# Patient Record
Sex: Male | Born: 1935
Health system: Southern US, Community
[De-identification: ages and names within clinical notes are randomized; demographics above are authoritative.]

## PROBLEM LIST (undated history)

## (undated) DIAGNOSIS — N189 Chronic kidney disease, unspecified: Secondary | ICD-10-CM

## (undated) DIAGNOSIS — E7800 Pure hypercholesterolemia, unspecified: Secondary | ICD-10-CM

## (undated) DIAGNOSIS — Z0389 Encounter for observation for other suspected diseases and conditions ruled out: Secondary | ICD-10-CM

## (undated) DIAGNOSIS — Z952 Presence of prosthetic heart valve: Secondary | ICD-10-CM

## (undated) DIAGNOSIS — I517 Cardiomegaly: Secondary | ICD-10-CM

## (undated) DIAGNOSIS — E785 Hyperlipidemia, unspecified: Secondary | ICD-10-CM

## (undated) DIAGNOSIS — I10 Essential (primary) hypertension: Secondary | ICD-10-CM

## (undated) DIAGNOSIS — I350 Nonrheumatic aortic (valve) stenosis: Secondary | ICD-10-CM

## (undated) DIAGNOSIS — I452 Bifascicular block: Secondary | ICD-10-CM

## (undated) HISTORY — DX: Essential (primary) hypertension: I10

## (undated) HISTORY — DX: Nonrheumatic aortic (valve) stenosis: I35.0

## (undated) HISTORY — DX: Hyperlipidemia, unspecified: E78.5

## (undated) HISTORY — DX: Encounter for observation for other suspected diseases and conditions ruled out: Z03.89

## (undated) HISTORY — DX: Pure hypercholesterolemia, unspecified: E78.00

## (undated) HISTORY — DX: Chronic kidney disease, unspecified: N18.9

## (undated) HISTORY — DX: Cardiomegaly: I51.7

## (undated) HISTORY — DX: Bifascicular block: I45.2

## (undated) HISTORY — PX: ELBOW SURGERY: SHX618

---

## 2009-06-19 DIAGNOSIS — I359 Nonrheumatic aortic valve disorder, unspecified: Secondary | ICD-10-CM | POA: Insufficient documentation

## 2009-06-19 DIAGNOSIS — I350 Nonrheumatic aortic (valve) stenosis: Secondary | ICD-10-CM

## 2009-06-19 HISTORY — DX: Nonrheumatic aortic (valve) stenosis: I35.0

## 2013-11-01 ENCOUNTER — Encounter: Payer: Self-pay | Admitting: Interventional Cardiology

## 2013-11-01 ENCOUNTER — Ambulatory Visit (INDEPENDENT_AMBULATORY_CARE_PROVIDER_SITE_OTHER): Payer: Commercial Managed Care - HMO | Admitting: Interventional Cardiology

## 2013-11-01 VITALS — BP 109/74 | HR 103 | Ht 70.0 in | Wt 230.0 lb

## 2013-11-01 DIAGNOSIS — E7800 Pure hypercholesterolemia, unspecified: Secondary | ICD-10-CM

## 2013-11-01 DIAGNOSIS — I517 Cardiomegaly: Secondary | ICD-10-CM

## 2013-11-01 DIAGNOSIS — I10 Essential (primary) hypertension: Secondary | ICD-10-CM

## 2013-11-01 DIAGNOSIS — I359 Nonrheumatic aortic valve disorder, unspecified: Secondary | ICD-10-CM

## 2013-11-01 DIAGNOSIS — I350 Nonrheumatic aortic (valve) stenosis: Secondary | ICD-10-CM

## 2013-11-01 DIAGNOSIS — Z0389 Encounter for observation for other suspected diseases and conditions ruled out: Secondary | ICD-10-CM | POA: Insufficient documentation

## 2013-11-01 LAB — LIPID PANEL
CHOLESTEROL: 200 mg/dL (ref 0–200)
HDL: 42.9 mg/dL (ref >39.00)
LDL Cholesterol: 135 mg/dL — ABNORMAL HIGH (ref 0–99)
NONHDL: 157.1
Total CHOL/HDL Ratio: 5
Triglycerides: 109 mg/dL (ref 0.0–149.0)
VLDL: 21.8 mg/dL (ref 0.0–40.0)

## 2013-11-01 NOTE — Progress Notes (Signed)
Patient ID: [name], male   DOB: Dec 23, 1931, 80 y.o.   MRN: [id number]    [place], [place], [place]  [address] Phone: [phone] Fax:  [phone]  Date:  11/01/2013   ID:  Robert Tapia, DOB 01/20/1932, MRN [id number]  PCP:  Milagros Evener, MD      History of Present Illness:  Robert Tapia is a 80 y.o. male with severe aortic stenosis, HTN, LVH, and HLD.  Recently, no CP with walking. He stopped walking daily for 15-30 minutes. He gets a little SHOB but he thinks this is related to age. No syncope. He has some swelling in his legs.  Uses compression stockings.  Has dyspnea with exertion (if walking fast), but not at rest. Has dizziness with sudden change in position. Denies syncope, chest pain, palpitations, orthopnea, PND, or worsening of RLE edema. BP around 110-130/70-84 at home. HR is around 70-76 bpm at home, but he states he has hx of tachycardia. Walks up steps and does yard work w/o shortness of breath or chest pain.   Wt Readings from Last 3 Encounters:  11/01/13 230 lb (104.327 kg)     Past Medical History  Diagnosis Date  . HTN (hypertension)   . Hyperlipidemia     Pt. declines cholesterol medication (07/03/09)  . Chronic renal disease     Referred Dr. Marval Regal 08/2009  . Severe aortic stenosis 06/2009    Moderate AS in the past. ECHO 11/05/12 - LV EF estimated by 2D at 65-70%. Mild left atrial enlargement. Mild mitral valve regurgitation. Severe aortic valve stenosis. Mild aortic root dilatation. Moderate concentric LV hypertrophy.  . Aortic valve disorders 06/2009    Moderate AS in the past. ECHO 11/05/12 - LV EF estimated by 2D at 65-70%. Mild left atrial enlargement. Mild mitral valve regurgitation. Severe aortic valve stenosis. Mild aortic root dilatation. Moderate concentric LV hypertrophy.  . Observation for suspected cardiovascular disease   . Essential hypertension, benign   . Pure hypercholesterolemia     LDL 164   . Lower extremity edema       Kidney cleanse has helped.  . Left ventricular hypertrophy     Moderate concentric LV hypertrophy by ECHO 11/05/12    Current Outpatient Prescriptions  Medication Sig Dispense Refill  . amLODipine (NORVASC) 10 MG tablet Take 10 mg by mouth daily.      . Garlic 0630 MG CAPS       . lisinopril-hydrochlorothiazide (PRINZIDE,ZESTORETIC) 20-25 MG per tablet Take 1 tablet by mouth daily.      . Multiple Vitamin (MULTIVITAMIN) capsule Take 1 capsule by mouth daily.      . niacin 500 MG tablet Take 500 mg by mouth daily.      . Nutritional Supplements (KIDNEY) CAPS Take 3 capsules by mouth daily. (Cleanse)      . Omega-3 Fatty Acids (MARINE LIPID CONCENTRATE) 240-360-5 MG-MG-UNIT CAPS Take 2 capsules by mouth daily.      Marland Kitchen Specialty Vitamins Products (HEART SAVIOR PO)        No current facility-administered medications for this visit.    Allergies:   No Known Allergies  Social History:  The patient  reports that he has never smoked. He does not have any smokeless tobacco history on file. He reports that he does not drink alcohol or use illicit drugs.   Family History:  The patient's family history includes Diabetes in his mother; Heart attack in his brother.   ROS:  Please see the  history of present illness.  No nausea, vomiting.  No fevers, chills.  No focal weakness.  No dysuria. *DOE   All other systems reviewed and negative.   PHYSICAL EXAM: VS:  BP 109/74  Pulse 103  Ht 5\' 10"  (1.778 m)  Wt 230 lb (104.327 kg)  BMI 33.00 kg/m2 Well nourished, well developed, in no acute distress HEENT: normal Neck: no JVD, no carotid bruits Cardiac:  normal S1, S2; RRR; 3/6 systolic murmur Lungs:  clear to auscultation bilaterally, no wheezing, rhonchi or rales Abd: soft, nontender, no hepatomegaly Ext: no edema Skin: warm and dry Neuro:   no focal abnormalities noted  EKG:  Sinus tahycardia, RBBB     ASSESSMENT AND PLAN:  Treatment  1. Aortic valve disorders  Notes: Severe AS in  2014. No CP, CHF, or syncope.Discussed sx to watch for regarding AS.  Mild DOE when walking fast.  lan for ETT to better evaluate exercise capacity.   2. Observation for suspected cardiovascular disease  IMAGING: EKG    Harward,Amy 10/25/2012 10:07:56 AM > VARANASI,JAY 10/25/2012 10:41:41 AM > NSR, RBBB   3. Essential hypertension, benign  Continue Amlodipine Besylate Tablet, 10MG , take one tablet by mouth every day Continue Lisinopril-Hydrochlorothiazide Tablet, 20-25 MG, 1 tablet, Orally, Once a day.  Controlled   4. Hypercholesteremia, pure  Continue Marine Lipid Concentrate Capsule, 240-360-5 MG-MG-UNIT, 2 capsule, Orally, Daily Continue Garlic Capsule, 4098 MG, Orally Notes: LDL 164 in 2013. Needs recheck. May benefit from statin if still elevated.    Preventive Medicine  Adult topics discussed:  Diet: healthy diet.  Exercise: 5 days a week, at least 30 minutes of aerobic exercise.      Signed, Mina Marble, MD, [facility] 11/01/2013 10:24 AM

## 2013-11-01 NOTE — Patient Instructions (Signed)
Your physician has requested that you have an exercise tolerance test. For further information please visit [email or web address]. Please also follow instruction sheet, as given.  Your physician wants you to follow-up in:  1 year with [name].  You will receive a reminder letter in the mail two months in advance. If you don't receive a letter, please call our office to schedule the follow-up appointment.

## 2013-11-02 ENCOUNTER — Other Ambulatory Visit: Payer: Self-pay | Admitting: Cardiology

## 2013-11-02 DIAGNOSIS — E7800 Pure hypercholesterolemia, unspecified: Secondary | ICD-10-CM

## 2013-12-02 ENCOUNTER — Telehealth (HOSPITAL_COMMUNITY): Payer: Self-pay

## 2013-12-02 NOTE — Telephone Encounter (Signed)
Encounter complete. 

## 2013-12-06 ENCOUNTER — Telehealth (HOSPITAL_COMMUNITY): Payer: Self-pay

## 2013-12-06 NOTE — Telephone Encounter (Signed)
Encounter complete. 

## 2013-12-07 ENCOUNTER — Ambulatory Visit (HOSPITAL_COMMUNITY)
Admission: RE | Admit: 2013-12-07 | Discharge: 2013-12-07 | Disposition: A | Discharge status: Home / Self Care | Payer: Medicare HMO | Source: Ambulatory Visit | Attending: Interventional Cardiology | Admitting: Interventional Cardiology

## 2013-12-07 DIAGNOSIS — R0609 Other forms of dyspnea: Secondary | ICD-10-CM

## 2013-12-07 DIAGNOSIS — I359 Nonrheumatic aortic valve disorder, unspecified: Secondary | ICD-10-CM

## 2013-12-07 DIAGNOSIS — R0989 Other specified symptoms and signs involving the circulatory and respiratory systems: Secondary | ICD-10-CM

## 2013-12-07 NOTE — Procedures (Signed)
Exercise Treadmill Test  Test  Exercise Tolerance Test Ordering MD: Lendell Caprice, MD  Interpreting MD:   Unique Test No:1  Treadmill:  1  Indication for ETT: chest pain - rule out ischemia  Contraindication to ETT: Yes   Stress Modality: exercise - treadmill  Cardiac Imaging Performed: non   Protocol: standard Bruce - maximal  Max BP:  146/56  Max MPHR (bpm):  140 85% MPR (bpm): 119  MPHR obtained (bpm):  162 % MPHR obtained:  115  Reached 85% MPHR (min:sec): 3:51 Total Exercise Time (min-sec):  3:51  Workload in METS: 5.60 Borg Scale:  Reason ETT Terminated:  fatigue    ST Segment Analysis At Rest: RBBB, tachycardia at rest With Exercise: no evidence of significant ST depression  Other Information Arrhythmia:  No Angina during ETT:  No angina, but extreme dyspnea Quality of ETT:  indeterminate  ETT Interpretation:  borderline (indeterminate) with non-specific ST changes  Comments: Very poor exercise tolerance THR was achieved, however, tachycardic at rest with minimal exercise RBBB with difficult to interpret ST changes, but no diagnostic ischemic changes Mild hypotension with exercise  Recommendations: Recommend more definitive additional imaging study  Pixie Casino, MD, [facility] Attending Cardiologist [place]

## 2013-12-09 ENCOUNTER — Other Ambulatory Visit: Payer: Self-pay | Admitting: Cardiology

## 2013-12-09 DIAGNOSIS — I350 Nonrheumatic aortic (valve) stenosis: Secondary | ICD-10-CM

## 2013-12-16 ENCOUNTER — Encounter: Payer: Self-pay | Admitting: Thoracic Surgery (Cardiothoracic Vascular Surgery)

## 2013-12-16 ENCOUNTER — Other Ambulatory Visit: Payer: Self-pay | Admitting: *Deleted

## 2013-12-16 ENCOUNTER — Ambulatory Visit
Admission: RE | Admit: 2013-12-16 | Discharge: 2013-12-16 | Disposition: A | Discharge status: Home / Self Care | Payer: Commercial Managed Care - HMO | Source: Ambulatory Visit | Attending: Thoracic Surgery (Cardiothoracic Vascular Surgery) | Admitting: Thoracic Surgery (Cardiothoracic Vascular Surgery)

## 2013-12-16 ENCOUNTER — Institutional Professional Consult (permissible substitution) (INDEPENDENT_AMBULATORY_CARE_PROVIDER_SITE_OTHER): Payer: Commercial Managed Care - HMO | Admitting: Thoracic Surgery (Cardiothoracic Vascular Surgery)

## 2013-12-16 VITALS — BP 126/78 | HR 85 | Resp 16 | Ht 70.0 in | Wt 231.0 lb

## 2013-12-16 DIAGNOSIS — I359 Nonrheumatic aortic valve disorder, unspecified: Secondary | ICD-10-CM

## 2013-12-16 DIAGNOSIS — I350 Nonrheumatic aortic (valve) stenosis: Secondary | ICD-10-CM

## 2013-12-16 DIAGNOSIS — I712 Thoracic aortic aneurysm, without rupture: Secondary | ICD-10-CM

## 2013-12-16 DIAGNOSIS — I7121 Aneurysm of the ascending aorta, without rupture: Secondary | ICD-10-CM

## 2013-12-16 NOTE — Progress Notes (Signed)
[address]       [place], [address]             [phone]     CARDIOTHORACIC SURGERY CONSULTATION REPORT  Referring Provider is Jettie Booze, MD PCP is Milagros Evener, MD  Chief Complaint  Patient presents with  . Aortic Stenosis    eval and treat...ECHO 11/05/12, EXERCISE TOLERANCE TEST 12/07/13    HPI:  Patient is an 80 year old retired white male from [place] with history of aortic stenosis, hypertension, and chronic kidney disease referred to discuss treatment options for management of severe aortic stenosis. The patient states that he was first told he had a heart murmur during childhood. He denies any known history of rheumatic fever or rheumatic heart disease. He has been physically active all of his life without any significant limitations until recently. He and his wife have moved on numerous occasions but has been living in this area for the last several years. He was noted to have a heart murmur on exam by his primary care physician, and an echocardiogram was performed demonstrating the presence of severe aortic stenosis. The patient was referred to [name] who has been following him for the last couple of years.  His last echocardiogram was performed in June of 2014. At that time the peak velocity across the aortic valve was reportedly measured 4.5 m/s corresponding to peak and mean transvalvular gradients estimated to be 82 and 48 mmHg, respectively. The aortic valve area was estimated 1.0 cm.  The patient has claimed to remain essentially asymptomatic, but a stress test was recommended after his last followup visit with [name].  Exercise treadmill test performed 12/07/2013 was notable for very poor exercise tolerance. The patient was noted to be tachycardic at rest.  He developed extreme dyspnea and hypotension early into a conventional Bruce protocol. He did not develop chest pain or EKG changes.  The patient was referred for  surgical consultation.  The patient is retired and lives with his wife. They remained reasonably active physically, and a walk at least 3 days a week. He states that he only gets short of breath if he pushes himself physically. With normal activity he denies any symptoms of exertional shortness of breath or chest discomfort. He denies any history of PND, orthopnea, or lower extremity edema. He has had occasional mild dizziness she stands up suddenly but he denies any history of syncope or prolonged dizzy spells.  Past Medical History  Diagnosis Date  . HTN (hypertension)   . Hyperlipidemia     Pt. declines cholesterol medication (07/03/09)  . Chronic renal disease     Referred Dr. Marval Regal 08/2009  . Severe aortic stenosis 06/2009    Moderate AS in the past. ECHO 11/05/12 - LV EF estimated by 2D at 65-70%. Mild left atrial enlargement. Mild mitral valve regurgitation. Severe aortic valve stenosis. Mild aortic root dilatation. Moderate concentric LV hypertrophy.  . Aortic valve disorders 06/2009    Moderate AS in the past. ECHO 11/05/12 - LV EF estimated by 2D at 65-70%. Mild left atrial enlargement. Mild mitral valve regurgitation. Severe aortic valve stenosis. Mild aortic root dilatation. Moderate concentric LV hypertrophy.  . Observation for suspected cardiovascular disease   . Essential hypertension, benign   . Pure hypercholesterolemia     LDL 164   . Lower extremity edema     Kidney cleanse has helped.  . Left ventricular hypertrophy     Moderate concentric LV  hypertrophy by ECHO 11/05/12    Past Surgical History  Procedure Laterality Date  . Elbow surgery Right     Family History  Problem Relation Age of Onset  . Diabetes Mother   . Heart attack Brother     HALF brother had MI & died at advance    History   Social History  . Marital Status: Married    Spouse Name: N/A    Number of Children: N/A  . Years of Education: N/A   Occupational History  . Not on file.   Social  History Main Topics  . Smoking status: Never Smoker   . Smokeless tobacco: Never Used  . Alcohol Use: No  . Drug Use: No  . Sexual Activity: Not on file   Other Topics Concern  . Not on file   Social History Narrative  . No narrative on file    Current Outpatient Prescriptions  Medication Sig Dispense Refill  . amLODipine (NORVASC) 10 MG tablet Take 10 mg by mouth daily.      . Garlic 8250 MG CAPS       . lisinopril-hydrochlorothiazide (PRINZIDE,ZESTORETIC) 20-25 MG per tablet Take 1 tablet by mouth daily.      . Multiple Vitamin (MULTIVITAMIN) capsule Take 1 capsule by mouth daily.      . niacin 500 MG tablet Take 500 mg by mouth daily.      . Nutritional Supplements (KIDNEY) CAPS Take 3 capsules by mouth daily. (Cleanse)      . Omega-3 Fatty Acids (MARINE LIPID CONCENTRATE) 240-360-5 MG-MG-UNIT CAPS Take 2 capsules by mouth daily.      Marland Kitchen Specialty Vitamins Products (HEART SAVIOR PO)        No current facility-administered medications for this visit.    No Known Allergies    Review of Systems:   General:  normal appetite, normal energy, no weight gain, no weight loss, no fever  Cardiac:  no chest pain with exertion, no chest pain at rest, + SOB with strenuous exertion, no resting SOB, no PND, no orthopnea, no palpitations, no arrhythmia, no atrial fibrillation, no LE edema, no dizzy spells, no syncope  Respiratory:  no shortness of breath, no home oxygen, no productive cough, no dry cough, no bronchitis, no wheezing, no hemoptysis, no asthma, no pain with inspiration or cough, no sleep apnea, no CPAP at night  GI:   no difficulty swallowing, no reflux, no frequent heartburn, no hiatal hernia, no abdominal pain, no constipation, no diarrhea, no hematochezia, no hematemesis, no melena  GU:   no dysuria,  + frequency, no urinary tract infection, no hematuria, no enlarged prostate, no kidney stones, + mild chronic kidney disease  Vascular:  no pain suggestive of claudication, no  pain in feet, no leg cramps, no varicose veins, no DVT, no non-healing foot ulcer  Neuro:   no stroke, no TIA's, no seizures, no headaches, no temporary blindness one eye,  no slurred speech, no peripheral neuropathy, no chronic pain, no instability of gait, no memory/cognitive dysfunction  Musculoskeletal: no arthritis, no joint swelling, no myalgias, no difficulty walking, normal mobility   Skin:   no rash, no itching, no skin infections, no pressure sores or ulcerations  Psych:   no anxiety, no depression, no nervousness, no unusual recent stress  Eyes:   no blurry vision, no floaters, no recent vision changes, + wears glasses or contacts  ENT:   no hearing loss, no loose or painful teeth, no dentures, last saw dentist within  6 months  Hematologic:  no easy bruising, no abnormal bleeding, no clotting disorder, no frequent epistaxis  Endocrine:  no diabetes, does not check CBG's at home     Physical Exam:   BP 126/78  Pulse 85  Resp 16  Ht 5\' 10"  (1.778 m)  Wt 231 lb (104.781 kg)  BMI 33.15 kg/m2  SpO2 96%  General:  Mildly obese but o/w  well-appearing and younger than stated age  67:  Unremarkable   Neck:   no JVD, no bruits, no adenopathy   Chest:   clear to auscultation, symmetrical breath sounds, no wheezes, no rhonchi   CV:   RRR, grade III/VI crescendo/decrescendo systolic murmur   Abdomen:  soft, non-tender, no masses   Extremities:  warm, well-perfused, pulses palpable, no LE edema  Rectal/GU  Deferred  Neuro:   Grossly non-focal and symmetrical throughout  Skin:   Clean and dry, no rashes, no breakdown   Diagnostic Tests:  Transthoracic Echocardiogram  Report of transthoracic echocardiogram performed 11/05/2012 at [facility] cardiology is reviewed. Images from this echocardiogram are not currently available for review.  By report the patient had severe aortic stenosis with normal left ventricular systolic function. Peak velocity across the aortic valve measured 4.5 m/s  corresponding to peak and mean transvalvular gradients estimated to be 82 and 48 mm Hg, respectively. The aortic valve area was estimated to be 1.0 cm. No other significant abnormalities were noted.   Exercise Treadmill Test  Test  Exercise Tolerance Test Ordering MD: Lendell Caprice, MD Interpreting MD:  Unique Test No:1 Treadmill: 1  Indication for ETT: chest pain - rule out ischemia  Contraindication to ETT: Yes  Stress Modality: exercise - treadmill Cardiac Imaging Performed: non  Protocol: standard Bruce - maximal Max BP: 146/56  Max MPHR (bpm): 140 85% MPR (bpm): 119  MPHR obtained (bpm): 162 % MPHR obtained: 115  Reached 85% MPHR (min:sec): 3:51 Total Exercise Time (min-sec):  3:51  Workload in METS: 5.60 Borg Scale:  Reason ETT Terminated: fatigue   ST Segment Analysis At Rest: RBBB, tachycardia at rest With Exercise: no evidence of significant ST depression  Other Information Arrhythmia: No Angina during ETT: No angina, but extreme  dyspnea Quality of ETT: indeterminate  ETT Interpretation: borderline (indeterminate) with non-specific ST changes  Comments: Very poor exercise tolerance THR was achieved, however, tachycardic at rest with minimal  exercise RBBB with difficult to interpret ST changes, but no diagnostic  ischemic changes Mild hypotension with exercise  Recommendations: Recommend more definitive additional imaging study  Pixie Casino, MD, [facility] Attending Cardiologist [facility]   Impression:  The patient has stage D severe aortic stenosis. He claims to be only mildly symptomatic, but he did very poorly with recent exercise testing. His last echocardiogram was performed more than one year ago. He is otherwise remarkably healthy for a gentleman his age with exception of history of mild chronic kidney disease. Risks associated with conventional surgical aortic valve replacement would likely be expected to be relatively low.   Plan:  The  patient and his wife counseled at length regarding surgical alternatives with respect to aortic valve replacement including continued medical therapy versus proceeding with conventional surgical aortic valve replacement using either a bioprosthetic tissue valve.  Other alternatives including transcatheter aortic valve replacement or conventional surgical aortic valve replacement using a mechanical prosthesis were discussed.  Expectations for the patient's postoperative convalescence following conventional surgical aortic valve replacement were discussed in detail and all of his questions  been addressed. At this point the patient seems inclined to proceed with further diagnostic testing and possible aortic valve replacement in the near future, but he also wants to think matters over further before making a final decision. At this point we will obtain followup echocardiogram since it has been over a year since his last echocardiogram. We will check his renal function and obtain a noncontrast CT scan of the chest to rule out significant calcification or aneurysmal disease involving the ascending thoracic aorta. Ultimately the patient will need left and right heart catheterization performed if he is planning to proceed with surgery in the near future. He will return in 4 weeks to discuss options further. All of his questions have been addressed.   I spent in excess of 90 minutes during the conduct of this office consultation and >50% of this time involved direct face-to-face encounter with the patient for counseling and/or coordination of their care.   Valentina Gu. Roxy Manns, MD 12/16/2013 2:27 PM

## 2013-12-17 LAB — BASIC METABOLIC PANEL
BUN: 28 mg/dL — AB (ref 6–23)
CALCIUM: 9.9 mg/dL (ref 8.4–10.5)
CO2: 24 meq/L (ref 19–32)
CREATININE: 1.75 mg/dL — AB (ref 0.50–1.35)
Chloride: 101 mEq/L (ref 96–112)
Glucose, Bld: 85 mg/dL (ref 70–99)
Potassium: 4.3 mEq/L (ref 3.5–5.3)
Sodium: 138 mEq/L (ref 135–145)

## 2014-01-02 ENCOUNTER — Telehealth: Payer: Self-pay | Admitting: *Deleted

## 2014-01-02 NOTE — Telephone Encounter (Signed)
PATIENT GOING WITH [facility], WILL TAKE ECHO AFTERWARDS TO SEE IF THERE IS A CHANGE//8/17//CM

## 2014-01-03 ENCOUNTER — Other Ambulatory Visit (HOSPITAL_COMMUNITY): Payer: Commercial Managed Care - HMO

## 2014-01-16 ENCOUNTER — Ambulatory Visit: Payer: Commercial Managed Care - HMO | Admitting: Thoracic Surgery (Cardiothoracic Vascular Surgery)

## 2014-05-03 ENCOUNTER — Other Ambulatory Visit: Payer: Commercial Managed Care - HMO

## 2014-11-08 ENCOUNTER — Encounter: Payer: Self-pay | Admitting: Interventional Cardiology

## 2014-11-08 ENCOUNTER — Ambulatory Visit (INDEPENDENT_AMBULATORY_CARE_PROVIDER_SITE_OTHER): Payer: Commercial Managed Care - HMO | Admitting: Interventional Cardiology

## 2014-11-08 VITALS — BP 128/80 | HR 79 | Ht 70.0 in | Wt 205.2 lb

## 2014-11-08 DIAGNOSIS — E7800 Pure hypercholesterolemia, unspecified: Secondary | ICD-10-CM

## 2014-11-08 DIAGNOSIS — E663 Overweight: Secondary | ICD-10-CM | POA: Diagnosis not present

## 2014-11-08 DIAGNOSIS — I350 Nonrheumatic aortic (valve) stenosis: Secondary | ICD-10-CM | POA: Diagnosis not present

## 2014-11-08 NOTE — Patient Instructions (Addendum)
**Note De-Identified Robert Tapia Obfuscation** Medication Instructions:  Same-No change  Labwork: None  Testing/Procedures: Your physician has requested that you have an echocardiogram. Echocardiography is a painless test that uses sound waves to create images of your heart. It provides your doctor with information about the size and shape of your heart and how well your heart's chambers and valves are working. This procedure takes approximately one hour. There are no restrictions for this procedure.    Follow-Up: Your physician recommends that you schedule a follow-up appointment in: as needed

## 2014-11-08 NOTE — Progress Notes (Signed)
Patient ID: Robert Tapia, male   DOB: [birth year], 81 y.o.   MRN: [id number]     Cardiology Office Note   Date:  11/08/2014   ID:  PEIGHTON EDGIN, DOB Apr 13, 1932, MRN [id number]  PCP:  Milagros Evener, MD    Chief Complaint  Patient presents with  . Follow-up    Aortic valve disorders     Wt Readings from Last 3 Encounters:  11/08/14 205 lb 3.2 oz (93.078 kg)  12/16/13 231 lb (104.781 kg)  11/01/13 230 lb (104.327 kg)       History of Present Illness: DEMARIUS Tapia is a 81 y.o. male  Who has severe AS.  He has been doing water aerobics 2x/week.  He has been doing his chelation therapy regularly to try to dissolve the calcium on the aortic valve.  With heavy exertion, he can get some chest tightness.    He has lost wieght as well.  He went to a gluten free diet and a specific diet recommended by his chelationist.  He is down 30 lbs.    He denies SHOB or lightheadedness.  He is hoping to move to [place].  He is looking at mid August.    Past Medical History  Diagnosis Date  . HTN (hypertension)   . Hyperlipidemia     Pt. declines cholesterol medication (07/03/09)  . Chronic renal disease     Referred Dr. Marval Regal 08/2009  . Severe aortic stenosis 06/2009    Moderate AS in the past. ECHO 11/05/12 - LV EF estimated by 2D at 65-70%. Mild left atrial enlargement. Mild mitral valve regurgitation. Severe aortic valve stenosis. Mild aortic root dilatation. Moderate concentric LV hypertrophy.  . Aortic valve disorders 06/2009    Moderate AS in the past. ECHO 11/05/12 - LV EF estimated by 2D at 65-70%. Mild left atrial enlargement. Mild mitral valve regurgitation. Severe aortic valve stenosis. Mild aortic root dilatation. Moderate concentric LV hypertrophy.  . Observation for suspected cardiovascular disease   . Essential hypertension, benign   . Pure hypercholesterolemia     LDL 164   . Lower extremity edema     Kidney cleanse has helped.  . Left ventricular hypertrophy    Moderate concentric LV hypertrophy by ECHO 11/05/12    Past Surgical History  Procedure Laterality Date  . Elbow surgery Right      Current Outpatient Prescriptions  Medication Sig Dispense Refill  . amLODipine (NORVASC) 10 MG tablet Take 10 mg by mouth daily.    . Ascorbic Acid (VITAMIN C) 500 MG CAPS Take 6 capsules by mouth daily.    Marland Kitchen B-Complex TABS Take 1 tablet by mouth daily.    . Cholecalciferol (VITAMIN D-3 PO) Take 1 tablet by mouth daily.    . Garlic 3662 MG CAPS     . lisinopril (PRINIVIL,ZESTRIL) 5 MG tablet Take 5 mg by mouth daily.    Marland Kitchen lisinopril-hydrochlorothiazide (PRINZIDE,ZESTORETIC) 20-25 MG per tablet Take 1 tablet by mouth daily.    . Multiple Vitamin (MULTIVITAMIN) capsule Take 1 capsule by mouth daily.    . Nutritional Supplements (DHEA PO) Take 1 tablet by mouth daily.    . Nutritional Supplements (KIDNEY) CAPS Take 3 capsules by mouth daily. (Cleanse)    . Omega-3 Fatty Acids (MARINE LIPID CONCENTRATE) 240-360-5 MG-MG-UNIT CAPS Take 2 capsules by mouth daily.    Marland Kitchen Specialty Vitamins Products (HEART SAVIOR PO)     . VITAMIN K PO Take 50 mg by mouth daily.  No current facility-administered medications for this visit.    Allergies:   Review of patient's allergies indicates no known allergies.    Social History:  The patient  reports that he has never smoked. He has never used smokeless tobacco. He reports that he does not drink alcohol or use illicit drugs.   Family History:  The patient's family history includes Diabetes in his mother; Heart attack in his brother.    ROS:  Please see the history of present illness.   Otherwise, review of systems are positive for improved breathing after weight loss.   All other systems are reviewed and negative.    PHYSICAL EXAM: VS:  BP 128/80 mmHg  Pulse 79  Ht 5\' 10"  (1.778 m)  Wt 205 lb 3.2 oz (93.078 kg)  BMI 29.44 kg/m2 , BMI Body mass index is 29.44 kg/(m^2). GEN: Well nourished, well developed, in no  acute distress HEENT: normal Neck: no JVD, carotid bruits, or masses Cardiac: RRR; 3/6 systolic  murmurs, rubs, or gallops,no edema  Respiratory:  clear to auscultation bilaterally, normal work of breathing GI: soft, nontender, nondistended, + BS MS: no deformity or atrophy Skin: warm and dry, no rash Neuro:  Strength and sensation are intact Psych: euthymic mood, full affect   EKG:   The ekg ordered today demonstrates NSR, RBBB, LAD   Recent Labs: 12/16/2013: BUN 28*; Creat 1.75*; Potassium 4.3; Sodium 138   Lipid Panel    Component Value Date/Time   CHOL 200 11/01/2013 1101   TRIG 109.0 11/01/2013 1101   HDL 42.90 11/01/2013 1101   CHOLHDL 5 11/01/2013 1101   VLDL 21.8 11/01/2013 1101   LDLCALC 135* 11/01/2013 1101     Other studies Reviewed: Additional studies/ records that were reviewed today with results demonstrating: 10/2012: Signif aortic stenosis. Consultation with Dr. Roxy Manns last year reviewed as well.   ASSESSMENT AND PLAN:  1. Aortic valve disease: Severe aortic stenosis by echo in 2014. He did not have significant symptoms. He has been trying chelation therapy and believes in this strongly. Will recheck echocardiogram to evaluate for any change in his aortic valve structure.  2. Obesity: I congratulated him on losing weight. Overall, his energy level and exercise tolerance is much improved since losing 30 pounds. I encouraged him to try to continue to lose weight. He feels that he has plateaued recently but I think if he continues the same lifestyle, the weight loss will start again. 3. Hyperlipidemia: LDL improved with lifestyle modifications. It was 135 year ago. Down from 160 for a few years back. He prefers trying natural remedies and lifestyle changes to keep the cholesterol down. 4. Hypertension: Blood pressure is controlled. Continue current medicines. 5. He will likely be moving to [place] this summer. We will have to transfer records to his new doctors  there.   Current medicines are reviewed at length with the patient today.  The patient concerns regarding his medicines were addressed.  The following changes have been made:  No change  Labs/ tests ordered today include: Echocardiogram   Orders Placed This Encounter  Procedures  . EKG 12-Lead  . Echocardiogram    Recommend 150 minutes/week of aerobic exercise Low fat, low carb, high fiber diet recommended  Disposition:   FU in when necessary given that he is moving to [place]., MD  11/08/2014 10:28 AM    [facility] [address], [place], [place]  [address] Phone: [phone]; Fax: [phone]     

## 2014-11-10 ENCOUNTER — Ambulatory Visit (HOSPITAL_COMMUNITY): Payer: Commercial Managed Care - HMO | Attending: Interventional Cardiology

## 2014-11-10 ENCOUNTER — Other Ambulatory Visit: Payer: Self-pay

## 2014-11-10 ENCOUNTER — Telehealth: Payer: Self-pay | Admitting: Nurse Practitioner

## 2014-11-10 DIAGNOSIS — I350 Nonrheumatic aortic (valve) stenosis: Secondary | ICD-10-CM

## 2014-11-10 DIAGNOSIS — E785 Hyperlipidemia, unspecified: Secondary | ICD-10-CM | POA: Insufficient documentation

## 2014-11-10 DIAGNOSIS — I10 Essential (primary) hypertension: Secondary | ICD-10-CM | POA: Diagnosis not present

## 2014-11-10 NOTE — Telephone Encounter (Signed)
Patient came in for echo appointment and brought medication list with corrections and would like [place] to update.  Corrections made per patient's written documentation.

## 2014-11-24 ENCOUNTER — Telehealth: Payer: Self-pay | Admitting: Interventional Cardiology

## 2014-11-24 NOTE — Telephone Encounter (Signed)
Follow Up  Pt called back for ECHO results//sr

## 2014-11-24 NOTE — Telephone Encounter (Signed)
Left message to call back  

## 2014-11-28 NOTE — Telephone Encounter (Signed)
Pt returned call. Informed pt of echo results. Pt verbalized understanding.

## 2016-06-12 ENCOUNTER — Telehealth: Payer: Self-pay | Admitting: Interventional Cardiology

## 2016-06-12 NOTE — Telephone Encounter (Signed)
**Note De-identified Mindie Rawdon Obfuscation** LMTCB

## 2016-06-12 NOTE — Telephone Encounter (Signed)
New Message   Pt requesting a call from nurse in regards to a MRI.

## 2016-08-11 DIAGNOSIS — Z0000 Encounter for general adult medical examination without abnormal findings: Secondary | ICD-10-CM | POA: Diagnosis not present

## 2016-08-11 DIAGNOSIS — N183 Chronic kidney disease, stage 3 (moderate): Secondary | ICD-10-CM | POA: Diagnosis not present

## 2016-08-11 DIAGNOSIS — Z23 Encounter for immunization: Secondary | ICD-10-CM | POA: Diagnosis not present

## 2016-08-11 DIAGNOSIS — I10 Essential (primary) hypertension: Secondary | ICD-10-CM | POA: Diagnosis not present

## 2016-08-11 DIAGNOSIS — E7800 Pure hypercholesterolemia, unspecified: Secondary | ICD-10-CM | POA: Diagnosis not present

## 2016-08-11 DIAGNOSIS — I350 Nonrheumatic aortic (valve) stenosis: Secondary | ICD-10-CM | POA: Diagnosis not present

## 2016-08-21 DIAGNOSIS — I129 Hypertensive chronic kidney disease with stage 1 through stage 4 chronic kidney disease, or unspecified chronic kidney disease: Secondary | ICD-10-CM | POA: Diagnosis not present

## 2016-08-21 DIAGNOSIS — N183 Chronic kidney disease, stage 3 (moderate): Secondary | ICD-10-CM | POA: Diagnosis not present

## 2016-08-21 DIAGNOSIS — E7800 Pure hypercholesterolemia, unspecified: Secondary | ICD-10-CM | POA: Diagnosis not present

## 2016-08-21 DIAGNOSIS — I10 Essential (primary) hypertension: Secondary | ICD-10-CM | POA: Diagnosis not present

## 2016-08-21 DIAGNOSIS — K08409 Partial loss of teeth, unspecified cause, unspecified class: Secondary | ICD-10-CM | POA: Diagnosis not present

## 2016-08-21 DIAGNOSIS — I350 Nonrheumatic aortic (valve) stenosis: Secondary | ICD-10-CM | POA: Diagnosis not present

## 2016-08-21 DIAGNOSIS — Z79899 Other long term (current) drug therapy: Secondary | ICD-10-CM | POA: Diagnosis not present

## 2016-08-21 DIAGNOSIS — Z972 Presence of dental prosthetic device (complete) (partial): Secondary | ICD-10-CM | POA: Diagnosis not present

## 2016-08-21 DIAGNOSIS — Z0000 Encounter for general adult medical examination without abnormal findings: Secondary | ICD-10-CM | POA: Diagnosis not present

## 2016-09-25 DIAGNOSIS — E7800 Pure hypercholesterolemia, unspecified: Secondary | ICD-10-CM | POA: Diagnosis not present

## 2016-09-25 DIAGNOSIS — E669 Obesity, unspecified: Secondary | ICD-10-CM | POA: Diagnosis not present

## 2016-09-25 DIAGNOSIS — N179 Acute kidney failure, unspecified: Secondary | ICD-10-CM | POA: Diagnosis not present

## 2016-09-25 DIAGNOSIS — I350 Nonrheumatic aortic (valve) stenosis: Secondary | ICD-10-CM | POA: Diagnosis not present

## 2016-09-25 DIAGNOSIS — R600 Localized edema: Secondary | ICD-10-CM | POA: Diagnosis not present

## 2016-09-25 DIAGNOSIS — N183 Chronic kidney disease, stage 3 (moderate): Secondary | ICD-10-CM | POA: Diagnosis not present

## 2016-09-25 DIAGNOSIS — I10 Essential (primary) hypertension: Secondary | ICD-10-CM | POA: Diagnosis not present

## 2017-06-18 DIAGNOSIS — I350 Nonrheumatic aortic (valve) stenosis: Secondary | ICD-10-CM | POA: Diagnosis not present

## 2017-06-18 DIAGNOSIS — R600 Localized edema: Secondary | ICD-10-CM | POA: Diagnosis not present

## 2017-06-18 DIAGNOSIS — I129 Hypertensive chronic kidney disease with stage 1 through stage 4 chronic kidney disease, or unspecified chronic kidney disease: Secondary | ICD-10-CM | POA: Diagnosis not present

## 2017-06-18 DIAGNOSIS — E669 Obesity, unspecified: Secondary | ICD-10-CM | POA: Diagnosis not present

## 2017-06-18 DIAGNOSIS — N183 Chronic kidney disease, stage 3 (moderate): Secondary | ICD-10-CM | POA: Diagnosis not present

## 2017-06-18 DIAGNOSIS — N179 Acute kidney failure, unspecified: Secondary | ICD-10-CM | POA: Diagnosis not present

## 2017-06-18 DIAGNOSIS — E7800 Pure hypercholesterolemia, unspecified: Secondary | ICD-10-CM | POA: Diagnosis not present

## 2017-07-10 DIAGNOSIS — N529 Male erectile dysfunction, unspecified: Secondary | ICD-10-CM | POA: Diagnosis not present

## 2017-07-10 DIAGNOSIS — Z8249 Family history of ischemic heart disease and other diseases of the circulatory system: Secondary | ICD-10-CM | POA: Diagnosis not present

## 2017-07-10 DIAGNOSIS — R69 Illness, unspecified: Secondary | ICD-10-CM | POA: Diagnosis not present

## 2017-07-10 DIAGNOSIS — Z833 Family history of diabetes mellitus: Secondary | ICD-10-CM | POA: Diagnosis not present

## 2017-07-10 DIAGNOSIS — I10 Essential (primary) hypertension: Secondary | ICD-10-CM | POA: Diagnosis not present

## 2017-08-12 DIAGNOSIS — Z0000 Encounter for general adult medical examination without abnormal findings: Secondary | ICD-10-CM | POA: Diagnosis not present

## 2017-08-12 DIAGNOSIS — I350 Nonrheumatic aortic (valve) stenosis: Secondary | ICD-10-CM | POA: Diagnosis not present

## 2017-08-12 DIAGNOSIS — E7800 Pure hypercholesterolemia, unspecified: Secondary | ICD-10-CM | POA: Diagnosis not present

## 2017-08-12 DIAGNOSIS — N183 Chronic kidney disease, stage 3 (moderate): Secondary | ICD-10-CM | POA: Diagnosis not present

## 2017-08-12 DIAGNOSIS — I10 Essential (primary) hypertension: Secondary | ICD-10-CM | POA: Diagnosis not present

## 2017-08-12 DIAGNOSIS — Z125 Encounter for screening for malignant neoplasm of prostate: Secondary | ICD-10-CM | POA: Diagnosis not present

## 2017-08-19 NOTE — Progress Notes (Signed)
Cardiology Office Note   Date:  08/20/2017   ID:  Robert Tapia, DOB 05/16/1932, MRN [id number]  PCP:  Aretta Nip, MD    No chief complaint on file.  Aortic stenosis  Wt Readings from Last 3 Encounters:  08/20/17 204 lb 3.2 oz (92.6 kg)  11/08/14 205 lb 3.2 oz (93.1 kg)  12/16/13 231 lb (104.8 kg)       History of Present Illness: Robert Tapia is a 84 y.o. male  Who has moderate to severe AS; last valve area in 2016 was 1.04 cm2.   He has been doing water aerobics 2x/week.  He has done chelation therapy [facility])  regularly to try to dissolve the calcium on the aortic valve.  THe last time I saw him was in 2016.    He went to a gluten free diet and a specific diet recommended by his chelationist.  He lost 30 lbs a few years ago.  At that time, he was hoping to move to [place].  He lost weight several years ago and has kept it off.  Denies : Dizziness. Leg edema. Nitroglycerin use. Orthopnea. Palpitations. Paroxysmal nocturnal dyspnea. Syncope.   He has some DOE if he tries to walk fast. He has had some chest tightness with walking as well.  He thinks it is because of the valve and that he needs IV chelation to help reverse the calcification process.  He has not moved to [place], but is still thinking about it.   He is adament about not taking a statin.     Past Medical History:  Diagnosis Date  . Aortic valve disorders 06/2009   Moderate AS in the past. ECHO 11/05/12 - LV EF estimated by 2D at 65-70%. Mild left atrial enlargement. Mild mitral valve regurgitation. Severe aortic valve stenosis. Mild aortic root dilatation. Moderate concentric LV hypertrophy.  . Chronic renal disease    Referred Dr. Marval Regal 08/2009  . Essential hypertension, benign   . HTN (hypertension)   . Hyperlipidemia    Pt. declines cholesterol medication (07/03/09)  . Left ventricular hypertrophy    Moderate concentric LV hypertrophy by ECHO 11/05/12  . Lower extremity edema    Kidney cleanse has helped.  . Observation for suspected cardiovascular disease   . Pure hypercholesterolemia    LDL 164   . Severe aortic stenosis 06/2009   Moderate AS in the past. ECHO 11/05/12 - LV EF estimated by 2D at 65-70%. Mild left atrial enlargement. Mild mitral valve regurgitation. Severe aortic valve stenosis. Mild aortic root dilatation. Moderate concentric LV hypertrophy.    Past Surgical History:  Procedure Laterality Date  . ELBOW SURGERY Right      Current Outpatient Medications  Medication Sig Dispense Refill  . amLODipine (NORVASC) 10 MG tablet Take 10 mg by mouth daily.    . Ascorbic Acid (VITAMIN C) 500 MG CAPS Take 6 capsules by mouth daily.    Marland Kitchen B-Complex TABS Take 1 tablet by mouth daily.    . Cholecalciferol (VITAMIN D-3 PO) Take 1 tablet by mouth daily.     Marland Kitchen CRANBERRY PO Take 1 tablet by mouth daily.    . Garlic 803 MG CAPS Take 300 mg by mouth daily.    Marland Kitchen lisinopril (PRINIVIL,ZESTRIL) 5 MG tablet Take 5 mg by mouth daily.    . magnesium gluconate (MAGONATE) 500 MG tablet Take 750 mg by mouth at bedtime.    . Multiple Vitamin (MULTIVITAMIN) capsule Take 2 capsules by mouth  daily.    . Nutritional Supplements (DHEA PO) Take 1 tablet by mouth daily.    . Nutritional Supplements (KIDNEY) CAPS Take 2 capsules by mouth daily. (Cleanse)    . Nutritional Supplements (NUTRITIONAL SUPPLEMENT PO) Take 1 tablet by mouth 2 (two) times daily. Synergy    . Omega-3 Fatty Acids (MARINE LIPID CONCENTRATE) 240-360-5 MG-MG-UNIT CAPS Take 2 capsules by mouth daily.    Marland Kitchen OVER THE COUNTER MEDICATION Take 640 mg by mouth 2 (two) times daily. OmegaPure    . OVER THE COUNTER MEDICATION Take 1 tablet by mouth 2 (two) times daily. K2-7    . OVER THE COUNTER MEDICATION Take 1 tablet by mouth daily. Liver Protect    . Specialty Vitamins Products (HEART SAVIOR PO) Take 2 tablets by mouth 2 (two) times daily.     Marland Kitchen VITAMIN K PO Take 50 mg by mouth daily.     No current  facility-administered medications for this visit.     Allergies:   Patient has no known allergies.    Social History:  The patient  reports that he has never smoked. He has never used smokeless tobacco. He reports that he does not drink alcohol or use drugs.   Family History:  The patient's family history includes Diabetes in his mother; Heart attack in his brother.    ROS:  Please see the history of present illness.   Otherwise, review of systems are positive for DOE.   All other systems are reviewed and negative.    PHYSICAL EXAM: VS:  BP 122/80   Pulse 83   Ht 5\' 10"  (1.778 m)   Wt 204 lb 3.2 oz (92.6 kg)   SpO2 95%   BMI 29.30 kg/m  , BMI Body mass index is 29.3 kg/m. GEN: Well nourished, well developed, in no acute distress  HEENT: normal  Neck: no JVD, carotid bruits, or masses Cardiac: RRR; 3/6 systolic murmurs, rubs, or gallops,no edema  Respiratory:  clear to auscultation bilaterally, normal work of breathing GI: soft, nontender, nondistended, + BS MS: no deformity or atrophy  Skin: warm and dry, no rash Neuro:  Strength and sensation are intact Psych: euthymic mood, full affect   EKG:   The ekg ordered today demonstrates NSR, RBBB, LAFB, no ST changes   Recent Labs: No results found for requested labs within last 8760 hours.   Lipid Panel    Component Value Date/Time   CHOL 200 11/01/2013 1101   TRIG 109.0 11/01/2013 1101   HDL 42.90 11/01/2013 1101   CHOLHDL 5 11/01/2013 1101   VLDL 21.8 11/01/2013 1101   LDLCALC 135 (H) 11/01/2013 1101     Other studies Reviewed: Additional studies/ records that were reviewed today with results demonstrating: 2016 echo.   ASSESSMENT AND PLAN:  1. Aortic valve disease: Check echo to see if aortic valve stenosis has changed.  He has some sx c/w severe AS.  If echo shows severe AS, would plan for left and right heart cath and referral to surgeon for valve replacement.  2. Overweight: Continue healthy diet to  maintain his weight loss from a few years ago. 3. Hyperlipidemia: He does not want to try a statin.  He will try some natural remedy.   4. HTN: The current medical regimen is effective;  continue present plan and medications.    Current medicines are reviewed at length with the patient today.  The patient concerns regarding his medicines were addressed.  The following changes have been made:  No change  Labs/ tests ordered today include:  No orders of the defined types were placed in this encounter.   Recommend 150 minutes/week of aerobic exercise Low fat, low carb, high fiber diet recommended  Disposition:   FU for echo   Signed, Larae Grooms, MD  08/20/2017 1:41 PM    [facility], [place], [place]  [address] Phone: [phone]; Fax: [phone]

## 2017-08-20 ENCOUNTER — Encounter: Payer: Self-pay | Admitting: Interventional Cardiology

## 2017-08-20 ENCOUNTER — Ambulatory Visit: Payer: Medicare HMO | Admitting: Interventional Cardiology

## 2017-08-20 VITALS — BP 122/80 | HR 83 | Ht 70.0 in | Wt 204.2 lb

## 2017-08-20 DIAGNOSIS — I10 Essential (primary) hypertension: Secondary | ICD-10-CM

## 2017-08-20 DIAGNOSIS — E7800 Pure hypercholesterolemia, unspecified: Secondary | ICD-10-CM

## 2017-08-20 DIAGNOSIS — E663 Overweight: Secondary | ICD-10-CM | POA: Diagnosis not present

## 2017-08-20 DIAGNOSIS — E782 Mixed hyperlipidemia: Secondary | ICD-10-CM | POA: Diagnosis not present

## 2017-08-20 DIAGNOSIS — I350 Nonrheumatic aortic (valve) stenosis: Secondary | ICD-10-CM

## 2017-08-20 NOTE — Patient Instructions (Signed)
Medication Instructions:  Your physician recommends that you continue on your current medications as directed. Please refer to the Current Medication list given to you today.   Labwork: None ordered  Testing/Procedures: Your physician has requested that you have an echocardiogram. Echocardiography is a painless test that uses sound waves to create images of your heart. It provides your doctor with information about the size and shape of your heart and how well your heart's chambers and valves are working. This procedure takes approximately one hour. There are no restrictions for this procedure.  Follow-Up: Based on your echocardiogram results   Any Other Special Instructions Will Be Listed Below (If Applicable).  Echocardiogram An echocardiogram, or echocardiography, uses sound waves (ultrasound) to produce an image of your heart. The echocardiogram is simple, painless, obtained within a short period of time, and offers valuable information to your health care provider. The images from an echocardiogram can provide information such as:  Evidence of coronary artery disease (CAD).  Heart size.  Heart muscle function.  Heart valve function.  Aneurysm detection.  Evidence of a past heart attack.  Fluid buildup around the heart.  Heart muscle thickening.  Assess heart valve function.  Tell a health care provider about:  Any allergies you have.  All medicines you are taking, including vitamins, herbs, eye drops, creams, and over-the-counter medicines.  Any problems you or family members have had with anesthetic medicines.  Any blood disorders you have.  Any surgeries you have had.  Any medical conditions you have.  Whether you are pregnant or may be pregnant. What happens before the procedure? No special preparation is needed. Eat and drink normally. What happens during the procedure?  In order to produce an image of your heart, gel will be applied to your chest and  a wand-like tool (transducer) will be moved over your chest. The gel will help transmit the sound waves from the transducer. The sound waves will harmlessly bounce off your heart to allow the heart images to be captured in real-time motion. These images will then be recorded.  You may need an IV to receive a medicine that improves the quality of the pictures. What happens after the procedure? You may return to your normal schedule including diet, activities, and medicines, unless your health care provider tells you otherwise. This information is not intended to replace advice given to you by your health care provider. Make sure you discuss any questions you have with your health care provider. Document Released: 05/02/2000 Document Revised: 12/22/2015 Document Reviewed: 01/10/2013 Elsevier Interactive Patient Education  2017 [employer].    If you need a refill on your cardiac medications before your next appointment, please call your pharmacy.

## 2017-09-10 ENCOUNTER — Other Ambulatory Visit: Payer: Self-pay

## 2017-09-10 ENCOUNTER — Ambulatory Visit (HOSPITAL_COMMUNITY): Payer: Medicare HMO | Attending: Cardiology

## 2017-09-10 DIAGNOSIS — I10 Essential (primary) hypertension: Secondary | ICD-10-CM | POA: Insufficient documentation

## 2017-09-10 DIAGNOSIS — E785 Hyperlipidemia, unspecified: Secondary | ICD-10-CM | POA: Diagnosis not present

## 2017-09-10 DIAGNOSIS — I350 Nonrheumatic aortic (valve) stenosis: Secondary | ICD-10-CM | POA: Diagnosis not present

## 2017-09-14 DIAGNOSIS — N183 Chronic kidney disease, stage 3 (moderate): Secondary | ICD-10-CM | POA: Diagnosis not present

## 2017-09-17 ENCOUNTER — Telehealth: Payer: Self-pay | Admitting: Interventional Cardiology

## 2017-09-17 DIAGNOSIS — I350 Nonrheumatic aortic (valve) stenosis: Secondary | ICD-10-CM

## 2017-09-17 NOTE — Telephone Encounter (Signed)
Late entry:  Contacted patient's wife (DPR on file). Spoke with the patient's wife and the patient and made them aware that his AS has progressed and he should consider an aortic valve replacement. Patient is in agreement. Made patient aware that he will need to have a Left and Right Heart Cath done and we will refer him to [facility]. Patient verbalized understanding. Patient only wants JV to do his cath. Offered next available on 5/17. Patient's wife ended up calling back to change the date to 5/21 (see below). Will bring patient in on 5/15 for OV for H&P/EKG/Labs to be within 30 days and discuss risk/benefits of procedure. Reviewed pre-procedure instructions with the patient and his wife over the phone and made them aware that we will review them again and provide the instructions on paper when they come in on 5/15. Made them aware that the referral will be placed to [facility] and they will be contacted to schedule an appointment with the surgeon. The patient denies having any lightheadedness, dizziness, SOB, chest pain, syncope, or any other symptoms at this time. Instructed for the patient to let us know if his symptoms change. They verbalized understanding and thanked me for the call.

## 2017-09-17 NOTE — Telephone Encounter (Signed)
Pt's wife is calling   Stating she wanting to cancel the cardiac cath. Please give pt's wife a call

## 2017-09-17 NOTE — Telephone Encounter (Signed)
-----   Message from Jettie Booze, MD sent at 09/14/2017  8:38 AM EDT ----- Aortic stenosis has progressed and we should consider valve replacement.  He will need left and right heart cath and referral to surgeon.

## 2017-09-28 NOTE — Telephone Encounter (Signed)
When reviewing the patient's chart I noticed that the patient called and cancelled his appointment with [name] on 09/30/17 and his appointment with Dr. Roxy Manns at [facility] on 10/19/17. Patient was scheduled for L&RHC on 10/06/17 for severe AS. Several unsuccessful attempts made to contact the patient. Called and spoke to patient's wife (DPR on file). Wife states that the patient does not want to proceed with the cath or AVR workup at this time. Explained the importance of having this done in the relatively near future. Wife states that the patient has a lot going on right now and does not want to proceed and will call back when he changes his mind. Will forward to [place] and send a message to patient's PCP to make aware.

## 2017-09-30 ENCOUNTER — Ambulatory Visit: Payer: Medicare HMO | Admitting: Interventional Cardiology

## 2017-10-06 ENCOUNTER — Encounter (HOSPITAL_COMMUNITY): Payer: Self-pay

## 2017-10-06 ENCOUNTER — Ambulatory Visit (HOSPITAL_COMMUNITY): Admit: 2017-10-06 | Payer: Medicare HMO | Admitting: Interventional Cardiology

## 2017-10-06 SURGERY — RIGHT/LEFT HEART CATH AND CORONARY ANGIOGRAPHY
Anesthesia: LOCAL

## 2017-10-19 ENCOUNTER — Encounter: Payer: Medicare HMO | Admitting: Thoracic Surgery (Cardiothoracic Vascular Surgery)

## 2017-12-14 DIAGNOSIS — N390 Urinary tract infection, site not specified: Secondary | ICD-10-CM | POA: Diagnosis not present

## 2017-12-14 DIAGNOSIS — N183 Chronic kidney disease, stage 3 (moderate): Secondary | ICD-10-CM | POA: Diagnosis not present

## 2017-12-16 DIAGNOSIS — N183 Chronic kidney disease, stage 3 (moderate): Secondary | ICD-10-CM | POA: Diagnosis not present

## 2017-12-16 DIAGNOSIS — R600 Localized edema: Secondary | ICD-10-CM | POA: Diagnosis not present

## 2017-12-16 DIAGNOSIS — E7800 Pure hypercholesterolemia, unspecified: Secondary | ICD-10-CM | POA: Diagnosis not present

## 2017-12-16 DIAGNOSIS — E669 Obesity, unspecified: Secondary | ICD-10-CM | POA: Diagnosis not present

## 2017-12-16 DIAGNOSIS — I129 Hypertensive chronic kidney disease with stage 1 through stage 4 chronic kidney disease, or unspecified chronic kidney disease: Secondary | ICD-10-CM | POA: Diagnosis not present

## 2017-12-16 DIAGNOSIS — N179 Acute kidney failure, unspecified: Secondary | ICD-10-CM | POA: Diagnosis not present

## 2017-12-16 DIAGNOSIS — I350 Nonrheumatic aortic (valve) stenosis: Secondary | ICD-10-CM | POA: Diagnosis not present

## 2018-01-12 ENCOUNTER — Telehealth: Payer: Self-pay | Admitting: Internal Medicine

## 2018-01-15 ENCOUNTER — Encounter: Payer: Self-pay | Admitting: Internal Medicine

## 2018-01-15 ENCOUNTER — Ambulatory Visit: Payer: Medicare HMO | Admitting: Internal Medicine

## 2018-01-15 VITALS — BP 110/70 | HR 81 | Ht 70.75 in | Wt 197.0 lb

## 2018-01-15 DIAGNOSIS — I350 Nonrheumatic aortic (valve) stenosis: Secondary | ICD-10-CM | POA: Diagnosis not present

## 2018-01-15 NOTE — Progress Notes (Signed)
ELECTROPHYSIOLOGY OFFICE NOTE  Patient ID: Robert Tapia, MRN: [id number], DOB/AGE: [birth year] 82 y.o. Admit date: (Not on file) Date of Consult: 01/15/2018  Primary Physician: Aretta Nip, MD Primary Cardiologist: Robert Tapia is a 84 y.o. male who is being seen today for the evaluation of aortic valve at his own request.  Have known as friend some years ago   Long-standing history of aortic valve disease.  2011 was recommended for consideration of aortic valve replacement.  Interval values as below.  4/19 seen by Dr. Saundra Shelling.  Echo at that time demonstrated progressive aortic stenosis.  He was referred to Dr. Roxy Manns and set up for catheterization.  He ended up canceling both.  He is here today because I was meeting with a mutual friend who would strongly encourage him to follow through.  I offered to see him and help navigate the next steps.  Denies syncope.  Denies chest pain.  Dyspnea on exertion.   DATE TEST EF   6/16 Echo   65 % AS severe mean 66  4/19 Echo   65-70 % AS severe- mean 76          Past Medical History:  Diagnosis Date  . Aortic valve disorders 06/2009   Moderate AS in the past. ECHO 11/05/12 - LV EF estimated by 2D at 65-70%. Mild left atrial enlargement. Mild mitral valve regurgitation. Severe aortic valve stenosis. Mild aortic root dilatation. Moderate concentric LV hypertrophy.  . Chronic renal disease    Referred Dr. Marval Regal 08/2009  . Essential hypertension, benign   . HTN (hypertension)   . Hyperlipidemia    Pt. declines cholesterol medication (07/03/09)  . Left ventricular hypertrophy    Moderate concentric LV hypertrophy by ECHO 11/05/12  . Lower extremity edema    Kidney cleanse has helped.  . Observation for suspected cardiovascular disease   . Pure hypercholesterolemia    LDL 164   . Severe aortic stenosis 06/2009   Moderate AS in the past. ECHO 11/05/12 - LV EF estimated by 2D at 65-70%. Mild left atrial enlargement. Mild  mitral valve regurgitation. Severe aortic valve stenosis. Mild aortic root dilatation. Moderate concentric LV hypertrophy.      Surgical History:  Past Surgical History:  Procedure Laterality Date  . ELBOW SURGERY Right      Home Meds: Prior to Admission medications   Medication Sig Start Date End Date Taking? Authorizing Provider  amLODipine (NORVASC) 10 MG tablet Take 10 mg by mouth daily.   Yes [provider]  amLODipine (NORVASC) 10 MG tablet Take 10 mg by mouth 3 (three) times a week.   Yes [provider]  Ascorbic Acid (VITAMIN C) 500 MG CAPS Take 6 capsules by mouth daily.   Yes [provider]  B-Complex TABS Take 1 tablet by mouth daily.   Yes [provider]  CRANBERRY PO Take 1 tablet by mouth daily.   Yes [provider]  lisinopril (PRINIVIL,ZESTRIL) 5 MG tablet Take 5 mg by mouth 4 (four) times a week.   Yes [provider]  magnesium gluconate (MAGONATE) 500 MG tablet Take 750 mg by mouth at bedtime.   Yes [provider]  Multiple Vitamin (MULTIVITAMIN) capsule Take 2 capsules by mouth daily.   Yes [provider]  Nutritional Supplements (DHEA PO) Take 1 tablet by mouth daily.   Yes [provider]  Nutritional Supplements (KIDNEY) CAPS Take 2 capsules by mouth  daily. (Cleanse)   Yes [provider]  Nutritional Supplements (NUTRITIONAL SUPPLEMENT PO) Take 1 tablet by mouth 2 (two) times daily. Synergy   Yes [provider]  Omega-3 Fatty Acids (MARINE LIPID CONCENTRATE) 240-360-5 MG-MG-UNIT CAPS Take 2 capsules by mouth daily.   Yes [provider]  OVER THE COUNTER MEDICATION Take 640 mg by mouth 2 (two) times daily. OmegaPure   Yes [provider]  OVER THE COUNTER MEDICATION Take 1 tablet by mouth 2 (two) times daily. K2-7   Yes [provider]  OVER THE COUNTER MEDICATION Take 1 tablet by mouth daily. Liver Protect   Yes [provider]  Specialty Vitamins Products (HEART SAVIOR PO) Take 2 tablets by mouth 2 (two) times daily.    Yes [provider]  VITAMIN K PO Take 50 mg by mouth daily.   Yes [provider]    Allergies: No Known Allergies  Social History   Socioeconomic History  . Marital status: Married    Spouse name: Not on file  . Number of children: Not on file  . Years of education: Not on file  . Highest education level: Not on file  Occupational History  . Not on file  Social Needs  . Financial resource strain: Not on file  . Food insecurity:    Worry: Not on file    Inability: Not on file  . Transportation needs:    Medical: Not on file    Non-medical: Not on file  Tobacco Use  . Smoking status: Never Smoker  . Smokeless tobacco: Never Used  Substance and Sexual Activity  . Alcohol use: No  . Drug use: No  . Sexual activity: Not on file  Lifestyle  . Physical activity:    Days per week: Not on file    Minutes per session: Not on file  . Stress: Not on file  Relationships  . Social connections:    Talks on phone: Not on file    Gets together: Not on file    Attends religious service: Not on file    Active member of club or organization: Not on file    Attends meetings of clubs or organizations: Not on file    Relationship status: Not on file  . Intimate partner violence:    Fear of current or ex partner: Not on file    Emotionally abused: Not on file    Physically abused: Not on file    Forced sexual activity: Not on file  Other Topics Concern  . Not on file  Social History Narrative  . Not on file     Family History  Problem Relation Age of Onset  . Diabetes Mother   . Heart attack Brother        HALF brother had MI & died at [facility]     ROS:  Please see the history of present illness.     All other systems reviewed and negative.    Physical Exam:= Blood pressure 110/70, height 5' 10.75" (1.797 m), weight 197 lb (89.4 kg). General: Well developed,  well nourished male in no acute distress. Head: Normocephalic, atraumatic, sclera non-icteric, no xanthomas, nares are without discharge. EENT: normal  Lymph Nodes:  none Neck: Transmitted murmur parvus et tardus. JVD 6-7 Back:without scoliosis kyphosis Lungs: Clear bilaterally to auscultation without wheezes, rales, or rhonchi. Breathing is unlabored. Heart: RRR with S1 single S2.  3/6 systolic  murmur . No rubs, or gallops appreciated. Abdomen: Soft,  non-tender, non-distended with normoactive bowel sounds. No hepatomegaly. No rebound/guarding. No obvious abdominal masses. Msk:  Strength and tone appear normal for age. Extremities: No clubbing or cyanosis. No edema.  Distal pedal pulses are 2+ and equal bilaterally. Skin: Warm and Dry Neuro: Alert and oriented X 3. CN III-XII intact Grossly normal sensory and motor function . Psych:  Responds to questions appropriately with a normal affect.      Labs: Cardiac Enzymes No results for input(s): CKTOTAL, CKMB, TROPONINI in the last 72 hours. CBC No results found for: WBC, HGB, HCT, MCV, PLT PROTIME: No results for input(s): LABPROT, INR in the last 72 hours. Chemistry No results for input(s): NA, K, CL, CO2, BUN, CREATININE, CALCIUM, PROT, BILITOT, ALKPHOS, ALT, AST, GLUCOSE in the last 168 hours.  Invalid input(s): LABALBU Lipids Lab Results  Component Value Date   CHOL 200 11/01/2013   HDL 42.90 11/01/2013   LDLCALC 135 (H) 11/01/2013   TRIG 109.0 11/01/2013   BNP No results found for: PROBNP Thyroid Function Tests: No results for input(s): TSH, T4TOTAL, T3FREE, THYROIDAB in the last 72 hours.  Invalid input(s): FREET3 Miscellaneous No results found for: DDIMER  Radiology/Studies:  No results found.  EKG: Sinus rhythm at 81 Intervals 15/13/37 Axis left -55 Bifascicular block right bundle branch block/LAFB   Assessment and Plan:  Aortic stenosis-severe  Right bundle branch block  CHF class II-III   Critical  aortic stenosis with modest symptoms.  Have reviewed with him the physiology and pathophysiology and the value of relief of the stenosis in terms of prognosis.  I have reached out to Dr. Edd Fabian as to next steps.    The presence of right bundle branch block with TAVI is associated with a high risk of complete heart block and some have suggested prophylactic dual-chamber pacemaker implantation prior to valve procedure.  This risk is higher than with surgical relief; both however are really quite high  I have reviewed this with the patient and his wife.  I will let them know following conversation with Dr. Burt Knack as to next steps  In the interim I have spoke with Dr. Saundra Shelling and Dr. MC--the former will reach out to the patient and arrange for catheterization and referral to the [facility] to discuss aortic valve replacement.   Robert Tapia

## 2018-01-15 NOTE — H&P (View-Only) (Signed)
ELECTROPHYSIOLOGY OFFICE NOTE  Patient ID: Robert Tapia, MRN: [id number], DOB/AGE: May 02, 1932 84 y.o. Admit date: (Not on file) Date of Consult: 01/15/2018  Primary Physician: Aretta Nip, MD Primary Cardiologist: JUWAN VENCES is a 84 y.o. male who is being seen today for the evaluation of aortic valve at his own request.  Have known as friend some years ago   Long-standing history of aortic valve disease.  2011 was recommended for consideration of aortic valve replacement.  Interval values as below.  4/19 seen by Dr. Saundra Shelling.  Echo at that time demonstrated progressive aortic stenosis.  He was referred to Dr. Roxy Manns and set up for catheterization.  He ended up canceling both.  He is here today because I was meeting with a mutual friend who would strongly encourage him to follow through.  I offered to see him and help navigate the next steps.  Denies syncope.  Denies chest pain.  Dyspnea on exertion.   DATE TEST EF   6/16 Echo   65 % AS severe mean 66  4/19 Echo   65-70 % AS severe- mean 76          Past Medical History:  Diagnosis Date  . Aortic valve disorders 06/2009   Moderate AS in the past. ECHO 11/05/12 - LV EF estimated by 2D at 65-70%. Mild left atrial enlargement. Mild mitral valve regurgitation. Severe aortic valve stenosis. Mild aortic root dilatation. Moderate concentric LV hypertrophy.  . Chronic renal disease    Referred Dr. Marval Regal 08/2009  . Essential hypertension, benign   . HTN (hypertension)   . Hyperlipidemia    Pt. declines cholesterol medication (07/03/09)  . Left ventricular hypertrophy    Moderate concentric LV hypertrophy by ECHO 11/05/12  . Lower extremity edema    Kidney cleanse has helped.  . Observation for suspected cardiovascular disease   . Pure hypercholesterolemia    LDL 164   . Severe aortic stenosis 06/2009   Moderate AS in the past. ECHO 11/05/12 - LV EF estimated by 2D at 65-70%. Mild left atrial enlargement. Mild  mitral valve regurgitation. Severe aortic valve stenosis. Mild aortic root dilatation. Moderate concentric LV hypertrophy.      Surgical History:  Past Surgical History:  Procedure Laterality Date  . ELBOW SURGERY Right      Home Meds: Prior to Admission medications   Medication Sig Start Date End Date Taking? Authorizing Provider  amLODipine (NORVASC) 10 MG tablet Take 10 mg by mouth daily.   Yes [provider]  amLODipine (NORVASC) 10 MG tablet Take 10 mg by mouth 3 (three) times a week.   Yes [provider]  Ascorbic Acid (VITAMIN C) 500 MG CAPS Take 6 capsules by mouth daily.   Yes [provider]  B-Complex TABS Take 1 tablet by mouth daily.   Yes [provider]  CRANBERRY PO Take 1 tablet by mouth daily.   Yes [provider]  lisinopril (PRINIVIL,ZESTRIL) 5 MG tablet Take 5 mg by mouth 4 (four) times a week.   Yes [provider]  magnesium gluconate (MAGONATE) 500 MG tablet Take 750 mg by mouth at bedtime.   Yes [provider]  Multiple Vitamin (MULTIVITAMIN) capsule Take 2 capsules by mouth daily.   Yes [provider]  Nutritional Supplements (DHEA PO) Take 1 tablet by mouth daily.   Yes [provider]  Nutritional Supplements (KIDNEY) CAPS Take 2 capsules by mouth  daily. (Cleanse)   Yes [provider]  Nutritional Supplements (NUTRITIONAL SUPPLEMENT PO) Take 1 tablet by mouth 2 (two) times daily. Synergy   Yes [provider]  Omega-3 Fatty Acids (MARINE LIPID CONCENTRATE) 240-360-5 MG-MG-UNIT CAPS Take 2 capsules by mouth daily.   Yes [provider]  OVER THE COUNTER MEDICATION Take 640 mg by mouth 2 (two) times daily. OmegaPure   Yes [provider]  OVER THE COUNTER MEDICATION Take 1 tablet by mouth 2 (two) times daily. K2-7   Yes [provider]  OVER THE COUNTER MEDICATION Take 1 tablet by mouth daily. Liver Protect   Yes [provider]  Specialty Vitamins Products (HEART SAVIOR PO) Take 2 tablets by mouth 2 (two) times daily.    Yes [provider]  VITAMIN K PO Take 50 mg by mouth daily.   Yes [provider]    Allergies: No Known Allergies  Social History   Socioeconomic History  . Marital status: Married    Spouse name: Not on file  . Number of children: Not on file  . Years of education: Not on file  . Highest education level: Not on file  Occupational History  . Not on file  Social Needs  . Financial resource strain: Not on file  . Food insecurity:    Worry: Not on file    Inability: Not on file  . Transportation needs:    Medical: Not on file    Non-medical: Not on file  Tobacco Use  . Smoking status: Never Smoker  . Smokeless tobacco: Never Used  Substance and Sexual Activity  . Alcohol use: No  . Drug use: No  . Sexual activity: Not on file  Lifestyle  . Physical activity:    Days per week: Not on file    Minutes per session: Not on file  . Stress: Not on file  Relationships  . Social connections:    Talks on phone: Not on file    Gets together: Not on file    Attends religious service: Not on file    Active member of club or organization: Not on file    Attends meetings of clubs or organizations: Not on file    Relationship status: Not on file  . Intimate partner violence:    Fear of current or ex partner: Not on file    Emotionally abused: Not on file    Physically abused: Not on file    Forced sexual activity: Not on file  Other Topics Concern  . Not on file  Social History Narrative  . Not on file     Family History  Problem Relation Age of Onset  . Diabetes Mother   . Heart attack Brother        HALF brother had MI & died at [facility]     ROS:  Please see the history of present illness.     All other systems reviewed and negative.    Physical Exam:= Blood pressure 110/70, height 5' 10.75" (1.797 m), weight 197 lb (89.4 kg). General: Well developed,  well nourished male in no acute distress. Head: Normocephalic, atraumatic, sclera non-icteric, no xanthomas, nares are without discharge. EENT: normal  Lymph Nodes:  none Neck: Transmitted murmur parvus et tardus. JVD 6-7 Back:without scoliosis kyphosis Lungs: Clear bilaterally to auscultation without wheezes, rales, or rhonchi. Breathing is unlabored. Heart: RRR with S1 single S2.  3/6 systolic  murmur . No rubs, or gallops appreciated. Abdomen: Soft,  non-tender, non-distended with normoactive bowel sounds. No hepatomegaly. No rebound/guarding. No obvious abdominal masses. Msk:  Strength and tone appear normal for age. Extremities: No clubbing or cyanosis. No edema.  Distal pedal pulses are 2+ and equal bilaterally. Skin: Warm and Dry Neuro: Alert and oriented X 3. CN III-XII intact Grossly normal sensory and motor function . Psych:  Responds to questions appropriately with a normal affect.      Labs: Cardiac Enzymes No results for input(s): CKTOTAL, CKMB, TROPONINI in the last 72 hours. CBC No results found for: WBC, HGB, HCT, MCV, PLT PROTIME: No results for input(s): LABPROT, INR in the last 72 hours. Chemistry No results for input(s): NA, K, CL, CO2, BUN, CREATININE, CALCIUM, PROT, BILITOT, ALKPHOS, ALT, AST, GLUCOSE in the last 168 hours.  Invalid input(s): LABALBU Lipids Lab Results  Component Value Date   CHOL 200 11/01/2013   HDL 42.90 11/01/2013   LDLCALC 135 (H) 11/01/2013   TRIG 109.0 11/01/2013   BNP No results found for: PROBNP Thyroid Function Tests: No results for input(s): TSH, T4TOTAL, T3FREE, THYROIDAB in the last 72 hours.  Invalid input(s): FREET3 Miscellaneous No results found for: DDIMER  Radiology/Studies:  No results found.  EKG: Sinus rhythm at 81 Intervals 15/13/37 Axis left -55 Bifascicular block right bundle branch block/LAFB   Assessment and Plan:  Aortic stenosis-severe  Right bundle branch block  CHF class II-III   Critical  aortic stenosis with modest symptoms.  Have reviewed with him the physiology and pathophysiology and the value of relief of the stenosis in terms of prognosis.  I have reached out to Dr. Edd Fabian as to next steps.    The presence of right bundle branch block with TAVI is associated with a high risk of complete heart block and some have suggested prophylactic dual-chamber pacemaker implantation prior to valve procedure.  This risk is higher than with surgical relief; both however are really quite high  I have reviewed this with the patient and his wife.  I will let them know following conversation with Dr. Burt Knack as to next steps  In the interim I have spoke with Dr. Saundra Shelling and Dr. MC--the former will reach out to the patient and arrange for catheterization and referral to the [facility] to discuss aortic valve replacement.   Virl Axe

## 2018-01-15 NOTE — Patient Instructions (Signed)
Medication Instructions:  Your physician recommends that you continue on your current medications as directed. Please refer to the Current Medication list given to you today.   Labwork: -None  Testing/Procedures: -None  Follow-Up: Dr. Caryl Comes will talk with Dr. Burt Knack and be in touch.   Any Other Special Instructions Will Be Listed Below (If Applicable).     If you need a refill on your cardiac medications before your next appointment, please call your pharmacy.

## 2018-01-26 ENCOUNTER — Telehealth: Payer: Self-pay

## 2018-01-26 DIAGNOSIS — Z01812 Encounter for preprocedural laboratory examination: Secondary | ICD-10-CM

## 2018-01-26 DIAGNOSIS — I350 Nonrheumatic aortic (valve) stenosis: Secondary | ICD-10-CM

## 2018-01-26 NOTE — Telephone Encounter (Signed)
Left message for patient to call back.  Need to schedule L/RHC and place TCTS referral.

## 2018-02-01 NOTE — Telephone Encounter (Signed)
Follow up   Pt's wife returning call for nurse about setting up surgery, states that they have been out of town. Please call

## 2018-02-01 NOTE — Telephone Encounter (Signed)
Called patient and reviewed pre-cath instructions below for his Left and Right Heart Catheterization scheduled for 9/20. Patient will come in for labs tomorrow and pick up instruction letter. Patient understands TCTS referral will be placed and he will be contacted to schedule appointment.     [facility] OFFICE [name], [place] [address] Dept: [phone] Loc: [phone]  Robert Tapia  02/01/2018  You are scheduled for a Cardiac Catheterization on Friday, September 20 with Dr. Larae Grooms.  1. Please arrive at the [facility] (Main Entrance A) at [facility]: [address] [place], [place] [address] at 9:30 AM (This time is two hours before your procedure to ensure your preparation). Free valet parking service is available.   Special note: Every effort is made to have your procedure done on time. Please understand that emergencies sometimes delay scheduled procedures.  2. Diet: Do not eat solid foods after midnight.  The patient may have clear liquids until 5am upon the day of the procedure.  3. Labs: You will need to have blood drawn on 02/02/18 for BMET, CBC  4. Medication instructions in preparation for your procedure:   Contrast Allergy: No   On the morning of your procedure, take a baby Aspirin 81 mg and any of your regular morning medicines.  You may use sips of water.  5. Plan for one night stay--bring personal belongings. 6. Bring a current list of your medications and current insurance cards. 7. You MUST have a responsible person to drive you home. 8. Someone MUST be with you the first 24 hours after you arrive home or your discharge will be delayed. 9. Please wear clothes that are easy to get on and off and wear slip-on shoes.  Thank you for allowing [place] to care for you!   -- [facility]

## 2018-02-01 NOTE — Telephone Encounter (Signed)
See telephone encounter 9/10

## 2018-02-01 NOTE — Addendum Note (Signed)
Addended by: Drue Novel I on: 02/01/2018 04:08 PM   Modules accepted: Orders

## 2018-02-01 NOTE — Telephone Encounter (Signed)
[place] left message for pt to call back to schedule R and L heart cath for possible TAVR .Will forward this message to [place] to schedule ./cy

## 2018-02-02 ENCOUNTER — Other Ambulatory Visit: Payer: Medicare HMO

## 2018-02-02 DIAGNOSIS — Z01812 Encounter for preprocedural laboratory examination: Secondary | ICD-10-CM

## 2018-02-02 DIAGNOSIS — I350 Nonrheumatic aortic (valve) stenosis: Secondary | ICD-10-CM | POA: Diagnosis not present

## 2018-02-02 LAB — BASIC METABOLIC PANEL
BUN / CREAT RATIO: 16 (ref 10–24)
BUN: 23 mg/dL (ref 8–27)
CHLORIDE: 103 mmol/L (ref 96–106)
CO2: 22 mmol/L (ref 20–29)
CREATININE: 1.41 mg/dL — AB (ref 0.76–1.27)
Calcium: 9.5 mg/dL (ref 8.6–10.2)
GFR calc Af Amer: 53 mL/min/{1.73_m2} — ABNORMAL LOW (ref >59)
GFR calc non Af Amer: 45 mL/min/{1.73_m2} — ABNORMAL LOW (ref >59)
GLUCOSE: 90 mg/dL (ref 65–99)
Potassium: 4.5 mmol/L (ref 3.5–5.2)
SODIUM: 140 mmol/L (ref 134–144)

## 2018-02-02 LAB — CBC
Hematocrit: 40.9 % (ref 37.5–51.0)
Hemoglobin: 14.3 g/dL (ref 13.0–17.7)
MCH: 30.8 pg (ref 26.6–33.0)
MCHC: 35 g/dL (ref 31.5–35.7)
MCV: 88 fL (ref 79–97)
PLATELETS: 178 10*3/uL (ref 150–450)
RBC: 4.65 x10E6/uL (ref 4.14–5.80)
RDW: 14.1 % (ref 12.3–15.4)
WBC: 8.5 10*3/uL (ref 3.4–10.8)

## 2018-02-05 ENCOUNTER — Ambulatory Visit (HOSPITAL_COMMUNITY)
Admission: RE | Admit: 2018-02-05 | Discharge: 2018-02-05 | Disposition: A | Discharge status: Home / Self Care | Payer: Medicare HMO | Source: Ambulatory Visit | Attending: Interventional Cardiology | Admitting: Interventional Cardiology

## 2018-02-05 ENCOUNTER — Encounter (HOSPITAL_COMMUNITY)
Admission: RE | Disposition: A | Discharge status: Home / Self Care | Payer: Self-pay | Source: Ambulatory Visit | Attending: Interventional Cardiology

## 2018-02-05 DIAGNOSIS — E7800 Pure hypercholesterolemia, unspecified: Secondary | ICD-10-CM | POA: Diagnosis not present

## 2018-02-05 DIAGNOSIS — I509 Heart failure, unspecified: Secondary | ICD-10-CM | POA: Diagnosis not present

## 2018-02-05 DIAGNOSIS — I4510 Unspecified right bundle-branch block: Secondary | ICD-10-CM | POA: Insufficient documentation

## 2018-02-05 DIAGNOSIS — I2510 Atherosclerotic heart disease of native coronary artery without angina pectoris: Secondary | ICD-10-CM | POA: Diagnosis not present

## 2018-02-05 DIAGNOSIS — I130 Hypertensive heart and chronic kidney disease with heart failure and stage 1 through stage 4 chronic kidney disease, or unspecified chronic kidney disease: Secondary | ICD-10-CM | POA: Insufficient documentation

## 2018-02-05 DIAGNOSIS — N189 Chronic kidney disease, unspecified: Secondary | ICD-10-CM | POA: Diagnosis not present

## 2018-02-05 DIAGNOSIS — I350 Nonrheumatic aortic (valve) stenosis: Secondary | ICD-10-CM | POA: Insufficient documentation

## 2018-02-05 LAB — POCT I-STAT 3, ART BLOOD GAS (G3+)
BICARBONATE: 25.2 mmol/L (ref 20.0–28.0)
O2 SAT: 97 %
PH ART: 7.366 (ref 7.350–7.450)
TCO2: 27 mmol/L (ref 22–32)
pCO2 arterial: 44.1 mmHg (ref 32.0–48.0)
pO2, Arterial: 96 mmHg (ref 83.0–108.0)

## 2018-02-05 LAB — POCT I-STAT 3, VENOUS BLOOD GAS (G3P V)
Bicarbonate: 26 mmol/L (ref 20.0–28.0)
O2 Saturation: 71 %
PH VEN: 7.353 (ref 7.250–7.430)
PO2 VEN: 39 mmHg (ref 32.0–45.0)
TCO2: 27 mmol/L (ref 22–32)
pCO2, Ven: 46.7 mmHg (ref 44.0–60.0)

## 2018-02-05 SURGERY — RIGHT HEART CATH AND CORONARY ANGIOGRAPHY
Anesthesia: LOCAL

## 2018-02-05 MED ORDER — MIDAZOLAM HCL 2 MG/2ML IJ SOLN
INTRAMUSCULAR | Status: AC
  Filled 2018-02-05: qty 2

## 2018-02-05 MED ORDER — VERAPAMIL HCL 2.5 MG/ML IV SOLN
INTRAVENOUS | Status: AC
  Filled 2018-02-05: qty 2

## 2018-02-05 MED ORDER — ONDANSETRON HCL 4 MG/2ML IJ SOLN: 4.0000 mg | Freq: Four times a day (QID) | INTRAMUSCULAR | Status: DC | PRN

## 2018-02-05 MED ORDER — VERAPAMIL HCL 2.5 MG/ML IV SOLN
INTRAVENOUS | Status: DC | PRN
  Administered 2018-02-05: 10 mL via INTRA_ARTERIAL

## 2018-02-05 MED ORDER — HEPARIN (PORCINE) IN NACL 1000-0.9 UT/500ML-% IV SOLN
INTRAVENOUS | Status: AC
  Filled 2018-02-05: qty 1000

## 2018-02-05 MED ORDER — SODIUM CHLORIDE 0.9 % WEIGHT BASED INFUSION: 1.0000 mL/kg/h | INTRAVENOUS | Status: DC

## 2018-02-05 MED ORDER — SODIUM CHLORIDE 0.9% FLUSH: 3.0000 mL | Freq: Two times a day (BID) | INTRAVENOUS | Status: DC

## 2018-02-05 MED ORDER — LIDOCAINE HCL (PF) 1 % IJ SOLN
INTRAMUSCULAR | Status: AC
  Filled 2018-02-05: qty 30

## 2018-02-05 MED ORDER — SODIUM CHLORIDE 0.9 % IV SOLN: INTRAVENOUS | Status: AC

## 2018-02-05 MED ORDER — LIDOCAINE HCL (PF) 1 % IJ SOLN
INTRAMUSCULAR | Status: DC | PRN
  Administered 2018-02-05 (×2): 2 mL

## 2018-02-05 MED ORDER — SODIUM CHLORIDE 0.9 % IV SOLN: 250.0000 mL | INTRAVENOUS | Status: DC | PRN

## 2018-02-05 MED ORDER — SODIUM CHLORIDE 0.9% FLUSH: 3.0000 mL | INTRAVENOUS | Status: DC | PRN

## 2018-02-05 MED ORDER — ACETAMINOPHEN 325 MG PO TABS: 650.0000 mg | ORAL_TABLET | ORAL | Status: DC | PRN

## 2018-02-05 MED ORDER — ASPIRIN 81 MG PO CHEW: 81.0000 mg | CHEWABLE_TABLET | ORAL | Status: DC

## 2018-02-05 MED ORDER — HEPARIN SODIUM (PORCINE) 1000 UNIT/ML IJ SOLN
INTRAMUSCULAR | Status: DC | PRN
  Administered 2018-02-05: 4000 [IU] via INTRAVENOUS

## 2018-02-05 MED ORDER — FENTANYL CITRATE (PF) 100 MCG/2ML IJ SOLN
INTRAMUSCULAR | Status: DC | PRN
  Administered 2018-02-05: 25 ug via INTRAVENOUS

## 2018-02-05 MED ORDER — HEPARIN SODIUM (PORCINE) 1000 UNIT/ML IJ SOLN
INTRAMUSCULAR | Status: AC
  Filled 2018-02-05: qty 1

## 2018-02-05 MED ORDER — IOHEXOL 350 MG/ML SOLN
INTRAVENOUS | Status: DC | PRN
  Administered 2018-02-05: 65 mL via INTRA_ARTERIAL

## 2018-02-05 MED ORDER — HEPARIN (PORCINE) IN NACL 1000-0.9 UT/500ML-% IV SOLN
INTRAVENOUS | Status: DC | PRN
  Administered 2018-02-05 (×2): 500 mL

## 2018-02-05 MED ORDER — FENTANYL CITRATE (PF) 100 MCG/2ML IJ SOLN
INTRAMUSCULAR | Status: AC
  Filled 2018-02-05: qty 2

## 2018-02-05 MED ORDER — MIDAZOLAM HCL 2 MG/2ML IJ SOLN
INTRAMUSCULAR | Status: DC | PRN
  Administered 2018-02-05: 1 mg via INTRAVENOUS

## 2018-02-05 MED ORDER — SODIUM CHLORIDE 0.9 % WEIGHT BASED INFUSION: 3.0000 mL/kg/h | INTRAVENOUS | Status: DC

## 2018-02-05 SURGICAL SUPPLY — 13 items
CATH 5FR JL3.5 JR4 ANG PIG MP (CATHETERS) ×2 IMPLANT
CATH BALLN WEDGE 5F 110CM (CATHETERS) ×2 IMPLANT
CATH INFINITI 5FR JL4 (CATHETERS) ×2 IMPLANT
COVER DOME SNAP 22 D (MISCELLANEOUS) ×2 IMPLANT
DEVICE RAD COMP TR BAND LRG (VASCULAR PRODUCTS) ×2 IMPLANT
GLIDESHEATH SLEND SS 6F .021 (SHEATH) ×2 IMPLANT
GUIDEWIRE INQWIRE 1.5J.035X260 (WIRE) ×1 IMPLANT
INQWIRE 1.5J .035X260CM (WIRE) ×2
KIT HEART LEFT (KITS) ×2 IMPLANT
PACK CARDIAC CATHETERIZATION (CUSTOM PROCEDURE TRAY) ×2 IMPLANT
SHEATH GLIDE SLENDER 4/5FR (SHEATH) ×2 IMPLANT
TRANSDUCER W/STOPCOCK (MISCELLANEOUS) ×2 IMPLANT
TUBING CIL FLEX 10 FLL-RA (TUBING) ×2 IMPLANT

## 2018-02-05 NOTE — Progress Notes (Signed)
Right antecubital introducer removed and pressure held x 85min and no bleeding or hematoma noted

## 2018-02-05 NOTE — Discharge Instructions (Signed)

## 2018-02-05 NOTE — Research (Signed)
PHDEV Informed Consent           Subject Name:   Robert Tapia    Subject met inclusion and exclusion criteria.  The informed consent form, study requirements and expectations were reviewed with the subject and questions and concerns were addressed prior to the signing of the consent form.  The subject verbalized understanding of the trial requirements.  The subject agreed to participate in the [facility] trial and signed the informed consent.  The informed consent was obtained prior to performance of any protocol-specific procedures for the subject.  A copy of the signed informed consent was given to the subject and a copy was placed in the subject's medical record.      [place] Kebin Maye, Research Assistant 02/05/2018 11:32 a.m.

## 2018-02-05 NOTE — Interval H&P Note (Signed)
Cath Lab Visit (complete for each Cath Lab visit)  Clinical Evaluation Leading to the Procedure:   ACS: No.  Non-ACS:    Anginal Classification: CCS III  Anti-ischemic medical therapy: Minimal Therapy (1 class of medications)  Non-Invasive Test Results: No non-invasive testing performed  Prior CABG: No previous CABG  Severe AS; prevalve replacement cath.    History and Physical Interval Note:  02/05/2018 12:38 PM  Robert Tapia  has presented today for surgery, with the diagnosis of as  The various methods of treatment have been discussed with the patient and family. After consideration of risks, benefits and other options for treatment, the patient has consented to  Procedure(s): RIGHT/LEFT HEART CATH AND CORONARY ANGIOGRAPHY (N/A) as a surgical intervention .  The patient's history has been reviewed, patient examined, no change in status, stable for surgery.  I have reviewed the patient's chart and labs.  Questions were answered to the patient's satisfaction.     Larae Grooms

## 2018-02-09 ENCOUNTER — Telehealth: Payer: Self-pay | Admitting: Interventional Cardiology

## 2018-02-09 NOTE — Telephone Encounter (Signed)
New message   Patient's wife wants to know if there was a call made today to set up an appointment or setup his valve replacement surgery? The patient's wife states that they missed the call and they do not have voice mail. Please call to discuss.

## 2018-02-09 NOTE — Telephone Encounter (Signed)
Called and spoke to patient's wife (DPR on file). Made her aware that [place] at Dr. Guy Sandifer office at [facility] was trying to reach the patient to schedule an appointment with Dr. Roxy Manns. Number provided. Wife states that she will reach out to schedule appointment.

## 2018-02-16 ENCOUNTER — Encounter: Payer: Medicare HMO | Admitting: Thoracic Surgery (Cardiothoracic Vascular Surgery)

## 2018-02-23 DIAGNOSIS — N183 Chronic kidney disease, stage 3 (moderate): Secondary | ICD-10-CM | POA: Diagnosis not present

## 2018-03-01 ENCOUNTER — Other Ambulatory Visit: Payer: Self-pay

## 2018-03-01 ENCOUNTER — Institutional Professional Consult (permissible substitution): Payer: Medicare HMO | Admitting: Thoracic Surgery (Cardiothoracic Vascular Surgery)

## 2018-03-01 ENCOUNTER — Encounter: Payer: Self-pay | Admitting: Thoracic Surgery (Cardiothoracic Vascular Surgery)

## 2018-03-01 VITALS — BP 104/64 | HR 80 | Resp 20 | Ht 69.5 in | Wt 189.0 lb

## 2018-03-01 DIAGNOSIS — I350 Nonrheumatic aortic (valve) stenosis: Secondary | ICD-10-CM | POA: Diagnosis not present

## 2018-03-01 DIAGNOSIS — I452 Bifascicular block: Secondary | ICD-10-CM | POA: Insufficient documentation

## 2018-03-01 NOTE — Progress Notes (Signed)
HEART AND [name] [facility] CONSULTATION REPORT  Referring Provider is Jettie Booze, MD  Electrophysiologist is Deboraha Sprang, MD PCP is Rankins, Bill Salinas, MD  Chief Complaint  Patient presents with  . Aortic Stenosis    Surgical eval. Cardiac Cath 02/05/18, ECHO 09/10/17     HPI:  Patient is an 84 year old male with known history of severe symptomatic aortic stenosis, hypertension, stage III chronic kidney disease, right bundle branch block with left anterior fascicular block, and hyperlipidemia who has been referred for surgical consultation to discuss treatment options for management of critical aortic stenosis.  Patient has long-standing history of heart murmur dating back to childhood and has been followed for several years by [name] with known history of aortic stenosis.  Serial echocardiograms have demonstrated gradual progression and severity of aortic stenosis.  Patient has claimed to remain essentially asymptomatic until recently.  He was previously referred for surgical consultation in 2015.  At that time echocardiogram suggested the presence of severe aortic stenosis with peak velocity across the aortic valve measured 4.5 m/s corresponding to mean transvalvular gradient estimated 48 mmHg.  Though the patient stated that he had no symptoms at that time, he did relatively poorly with an exercise treadmill stress test.  I had the opportunity to see the patient in consultation at that time, but because of his continued good functional status the patient decided not to proceed with elective surgical intervention.  Since then he has been treated with chelation therapy in effort to dissolve any calcium in his bloodstream.  He has continued to follow-up regularly with [name] who saw him most recently on August 20, 2017.  Follow-up echocardiogram performed at that time revealed essentially critical aortic stenosis  with peak velocity across aortic valve measured 5.5 m/s corresponding to mean transvalvular gradient estimated 76 mmHg.  The DVI was reported 0.21 and aortic valve area calculated 0.67 cm.  Left ventricular systolic function remains normal with ejection fraction estimated 65 to 70%.  The patient was encouraged to proceed with diagnostic catheterization and further work-up for possible surgical intervention, but again delayed.  More recently the patient was seen in consultation by Dr. Caryl Comes as a second opinion who also encouraged the patient can consider surgical intervention.  The patient subsequently underwent diagnostic cardiac catheterization by [name] February 05, 2018.  This revealed normal coronary artery anatomy with no significant coronary artery disease and normal right heart pressures.  Cardiothoracic surgical consultation was requested.  Patient is married and lives locally in [place] with his wife.  He has been retired for many years.  He remains quite active physically and cares for his wife who has some problems with physical mobility.  He does many chores around the house and mows her yard.  He does admit to exertional chest tightness and shortness of breath which he states occurs only with more strenuous physical exertion.  He denies any shortness of breath or chest tightness with low-level activity.  He has never had any resting shortness of breath, PND, orthopnea, nor lower extremity edema.  He denies any history of palpitations, dizzy spells, or syncope.  Past Medical History:  Diagnosis Date  . Bifascicular bundle branch block    RBBB and LAFB  . Chronic renal disease    Referred Dr. Marval Regal 08/2009  . Essential hypertension, benign   . HTN (hypertension)   . Hyperlipidemia    Pt. declines cholesterol medication (07/03/09)  . Left  ventricular hypertrophy    Moderate concentric LV hypertrophy by ECHO 11/05/12  . Lower extremity edema    Kidney cleanse has helped.  .  Observation for suspected cardiovascular disease   . Pure hypercholesterolemia    LDL 164   . Severe aortic stenosis     Past Surgical History:  Procedure Laterality Date  . ELBOW SURGERY Right     Family History  Problem Relation Age of Onset  . Diabetes Mother   . Heart attack Brother        HALF brother had MI & died at advance    Social History   Socioeconomic History  . Marital status: Married    Spouse name: Not on file  . Number of children: Not on file  . Years of education: Not on file  . Highest education level: Not on file  Occupational History  . Not on file  Social Needs  . Financial resource strain: Not on file  . Food insecurity:    Worry: Not on file    Inability: Not on file  . Transportation needs:    Medical: Not on file    Non-medical: Not on file  Tobacco Use  . Smoking status: Never Smoker  . Smokeless tobacco: Never Used  Substance and Sexual Activity  . Alcohol use: No  . Drug use: No  . Sexual activity: Not on file  Lifestyle  . Physical activity:    Days per week: Not on file    Minutes per session: Not on file  . Stress: Not on file  Relationships  . Social connections:    Talks on phone: Not on file    Gets together: Not on file    Attends religious service: Not on file    Active member of club or organization: Not on file    Attends meetings of clubs or organizations: Not on file    Relationship status: Not on file  . Intimate partner violence:    Fear of current or ex partner: Not on file    Emotionally abused: Not on file    Physically abused: Not on file    Forced sexual activity: Not on file  Other Topics Concern  . Not on file  Social History Narrative  . Not on file    Current Outpatient Medications  Medication Sig Dispense Refill  . amLODipine (NORVASC) 10 MG tablet Take 10 mg by mouth 3 (three) times a week.     . Cholecalciferol (VITAMIN D3) 5000 units CAPS Take 5,000 Units by mouth daily.    Marland Kitchen CRANBERRY PO  Take 12,600 mg by mouth daily.     Marland Kitchen lisinopril (PRINIVIL,ZESTRIL) 5 MG tablet Take 5 mg by mouth 4 (four) times a week.    . Magnesium 125 MG CAPS Take 250 mg by mouth every evening.    . Nutritional Supplements (DHEA PO) Take 45 mg by mouth daily.     Marland Kitchen OVER THE COUNTER MEDICATION Take 320 mg by mouth 2 (two) times daily. OmegaPure     . OVER THE COUNTER MEDICATION Take 1 tablet by mouth 2 (two) times daily. K2-7 Supplement    . OVER THE COUNTER MEDICATION Take 1 tablet by mouth daily. Liver Protect Supplement    . OVER THE COUNTER MEDICATION Take 1 capsule by mouth 2 (two) times daily. Heart Saver Cholesterol Supplement    . OVER THE COUNTER MEDICATION Take 1 tablet by mouth daily. Metabolic Synergy Supplement    . OVER THE  COUNTER MEDICATION Apply 1 application topically daily. Kelacream    . vitamin C (ASCORBIC ACID) 500 MG tablet Take 500 mg by mouth 3 (three) times daily.      No current facility-administered medications for this visit.     No Known Allergies    Review of Systems:   General:  normal appetite, normal energy, no weight gain, no weight loss, no fever  Cardiac:  + chest pain with exertion, no chest pain at rest, + SOB with strenuous exertion, no resting SOB, no PND, no orthopnea, no palpitations, no arrhythmia, no atrial fibrillation, no LE edema, no dizzy spells, no syncope  Respiratory:  no shortness of breath, no home oxygen, no productive cough, no dry cough, no bronchitis, no wheezing, no hemoptysis, no asthma, no pain with inspiration or cough, no sleep apnea, no CPAP at night  GI:   no difficulty swallowing, no reflux, no frequent heartburn, no hiatal hernia, no abdominal pain, no constipation, no diarrhea, no hematochezia, no hematemesis, no melena  GU:   no dysuria,  no frequency, no urinary tract infection, no hematuria, no enlarged prostate, no kidney stones, + kidney disease  Vascular:  no pain suggestive of claudication, no pain in feet, no leg cramps, no  varicose veins, no DVT, no non-healing foot ulcer  Neuro:   no stroke, no TIA's, no seizures, no headaches, no temporary blindness one eye,  no slurred speech, no peripheral neuropathy, no chronic pain, no instability of gait, no memory/cognitive dysfunction  Musculoskeletal: no arthritis, no joint swelling, no myalgias, no difficulty walking, normal mobility   Skin:   no rash, no itching, no skin infections, no pressure sores or ulcerations  Psych:   no anxiety, no depression, no nervousness, no unusual recent stress  Eyes:   no blurry vision, no floaters, no recent vision changes, + wears glasses or contacts  ENT:   no hearing loss, no loose or painful teeth, no dentures, last saw dentist 2018  Hematologic:  + easy bruising, no abnormal bleeding, no clotting disorder, no frequent epistaxis  Endocrine:  no diabetes, does not check CBG's at home           Physical Exam:   BP 104/64   Pulse 80   Resp 20   Ht 5' 9.5" (1.765 m)   Wt 189 lb (85.7 kg)   SpO2 97% Comment: RA  BMI 27.51 kg/m   General:    well-appearing  HEENT:  Unremarkable   Neck:   no JVD, no bruits, no adenopathy   Chest:   clear to auscultation, symmetrical breath sounds, no wheezes, no rhonchi   CV:   RRR, grade IV/VI crescendo/decrescendo murmur heard best at RUSB,  no diastolic murmur  Abdomen:  soft, non-tender, no masses   Extremities:  warm, well-perfused, pulses diminished but palpable, no LE edema  Rectal/GU  Deferred  Neuro:   Grossly non-focal and symmetrical throughout  Skin:   Clean and dry, no rashes, no breakdown   Diagnostic Tests:  Transthoracic Echocardiography  Patient:    Robert Tapia, Robert Tapia MR #:       [id number] Study Date: 09/10/2017 Gender:     M Age:        64 Height:     177.8 cm Weight:     92.6 kg BSA:        2.16 m^2 Pt. Status: Room:   [place]  SONOGRAPHER  [place],  [place], Outpatient  cc:  ------------------------------------------------------------------- LV EF: 65% -   70%  ------------------------------------------------------------------- Indications:      I35.0 Non-rheumatic aortic valve stenosis.  ------------------------------------------------------------------- History:   Risk factors:  Hypertension. Dyslipidemia.  ------------------------------------------------------------------- Study Conclusions  - Left ventricle: The cavity size was normal. Wall thickness was   increased in a pattern of mild LVH. Systolic function was   vigorous. The estimated ejection fraction was in the range of 65%   to 70%. Wall motion was normal; there were no regional wall   motion abnormalities. Doppler parameters are consistent with   abnormal left ventricular relaxation (grade 1 diastolic   dysfunction). - Aortic valve: Valve mobility was restricted. There was critical   stenosis. There was trivial regurgitation. Valve area (VTI): 0.75   cm^2. Valve area (Vmax): 0.67 cm^2. Valve area (Vmean): 0.55   cm^2. - Left atrium: The atrium was mildly dilated.  Impressions:  - Vigorous LV systolic function; mild LVH; mild diastolic   dysfunction; calcified aortic valve with critical aortic stenosis   (mean gradient 76 mmHg) and trace AI; mild LAE.  ------------------------------------------------------------------- Study data:  Comparison was made to the study of 11/10/2014.  Study status:  Routine.  Procedure:  The patient reported no pain pre or post test. Transthoracic echocardiography. Image quality was adequate.  Study completion:  There were no complications. Transthoracic echocardiography.  M-mode, complete 2D, spectral Doppler, and color Doppler.  Birthdate:  Patient birthdate: [birth year].  Age:  Patient is 84 yr old.  Sex:  Gender: male. BMI: 29.3 kg/m^2.  Blood pressure:     111/71  Patient status: Outpatient.  Study date:   Study date: 09/10/2017. Study time: 02:19 PM.  Location:  [facility]  -------------------------------------------------------------------  ------------------------------------------------------------------- Left ventricle:  The cavity size was normal. Wall thickness was increased in a pattern of mild LVH. Systolic function was vigorous. The estimated ejection fraction was in the range of 65% to 70%. Wall motion was normal; there were no regional wall motion abnormalities. Doppler parameters are consistent with abnormal left ventricular relaxation (grade 1 diastolic dysfunction).  ------------------------------------------------------------------- Aortic valve:   Severely calcified leaflets. Valve mobility was restricted.  Doppler:   There was critical stenosis.   There was trivial regurgitation.    VTI ratio of LVOT to aortic valve: 0.24. Valve area (VTI): 0.75 cm^2. Indexed valve area (VTI): 0.35 cm^2/m^2. Peak velocity ratio of LVOT to aortic valve: 0.21. Valve area (Vmax): 0.67 cm^2. Indexed valve area (Vmax): 0.31 cm^2/m^2. Mean velocity ratio of LVOT to aortic valve: 0.18. Valve area (Vmean): 0.55 cm^2. Indexed valve area (Vmean): 0.26 cm^2/m^2. Mean gradient (S): 76 mm Hg. Peak gradient (S): 122 mm Hg.  ------------------------------------------------------------------- Aorta:  Aortic root: The aortic root was normal in size.  ------------------------------------------------------------------- Mitral valve:   Structurally normal valve.   Mobility was not restricted.  Doppler:  Transvalvular velocity was within the normal range. There was no evidence for stenosis. There was trivial regurgitation.    Peak gradient (D): 2 mm Hg.  ------------------------------------------------------------------- Left atrium:  The atrium was mildly dilated.  ------------------------------------------------------------------- Right ventricle:  The cavity size was normal. Systolic  function was normal.  ------------------------------------------------------------------- Pulmonic valve:    Doppler:  Transvalvular velocity was within the normal range. There was no evidence for stenosis.  ------------------------------------------------------------------- Tricuspid valve:   Structurally normal valve.    Doppler: Transvalvular velocity was within the normal range. There was  trivial regurgitation.  ------------------------------------------------------------------- Pulmonary artery:   Systolic pressure was within the normal range.   ------------------------------------------------------------------- Right atrium:  The atrium was normal in size.  ------------------------------------------------------------------- Pericardium:  There was no pericardial effusion.  ------------------------------------------------------------------- Measurements   Left ventricle                            Value          Reference  LV ID, ED, PLAX chordal           (L)     33.2  mm       43 - 52  LV ID, ES, PLAX chordal           (L)     20.5  mm       23 - 38  LV fx shortening, PLAX chordal            38    %        >=29  LV PW thickness, ED                       12.26 mm       ---------  IVS/LV PW ratio, ED                       0.95           <=1.3  Stroke volume, 2D                         97    ml       ---------  Stroke volume/bsa, 2D                     45    ml/m^2   ---------  LV e&', lateral                            6.14  cm/s     ---------  LV E/e&', lateral                          12.46          ---------  LV e&', medial                             5.81  cm/s     ---------  LV E/e&', medial                           13.17          ---------  LV e&', average                            5.98  cm/s     ---------  LV E/e&', average                          12.8           ---------    Ventricular septum                        Value  Reference  IVS thickness,  ED                         11.66 mm       ---------    LVOT                                      Value          Reference  LVOT ID, S                                20    mm       ---------  LVOT area                                 3.14  cm^2     ---------  LVOT peak velocity, S                     117   cm/s     ---------  LVOT mean velocity, S                     71.1  cm/s     ---------  LVOT VTI, S                               31    cm       ---------  LVOT peak gradient, S                     5     mm Hg    ---------    Aortic valve                              Value          Reference  Aortic valve peak velocity, S             552   cm/s     ---------  Aortic valve mean velocity, S             404   cm/s     ---------  Aortic valve VTI, S                       129   cm       ---------  Aortic mean gradient, S                   76    mm Hg    ---------  Aortic peak gradient, S                   122   mm Hg    ---------  VTI ratio, LVOT/AV                        0.24           ---------  Aortic valve area, VTI                    0.75  cm^2     ---------  Aortic valve area/bsa, VTI                0.35  cm^2/m^2 ---------  Velocity ratio, peak, LVOT/AV             0.21           ---------  Aortic valve area, peak velocity          0.67  cm^2     ---------  Aortic valve area/bsa, peak               0.31  cm^2/m^2 ---------  velocity  Velocity ratio, mean, LVOT/AV             0.18           ---------  Aortic valve area, mean velocity          0.55  cm^2     ---------  Aortic valve area/bsa, mean               0.26  cm^2/m^2 ---------  velocity  Aortic regurg peak velocity               257   cm/s     ---------  Aortic regurg pressure half-time          816   ms       ---------  Aortic regurg peak gradient               26    mm Hg    ---------    Aorta                                     Value          Reference  Aortic root ID, ED                        33    mm       ---------      Left atrium                               Value          Reference  LA ID, A-P, ES                            40    mm       ---------  LA ID/bsa, A-P                            1.85  cm/m^2   <=2.2  LA volume, S                              81    ml       ---------  LA volume/bsa, S                          37.5  ml/m^2   ---------  LA volume, ES, 1-p A4C                    77    ml       ---------  LA volume/bsa, ES,  1-p A4C                35.6  ml/m^2   ---------  LA volume, ES, 1-p A2C                    77    ml       ---------  LA volume/bsa, ES, 1-p A2C                35.6  ml/m^2   ---------    Mitral valve                              Value          Reference  Mitral E-wave peak velocity               76.5  cm/s     ---------  Mitral A-wave peak velocity               113   cm/s     ---------  Mitral deceleration time          (H)     296   ms       150 - 230  Mitral peak gradient, D                   2     mm Hg    ---------  Mitral E/A ratio, peak                    0.7            ---------    Pulmonary arteries                        Value          Reference  PA pressure, S, DP                        21    mm Hg    <=30    Tricuspid valve                           Value          Reference  Tricuspid regurg peak velocity            214   cm/s     ---------  Tricuspid peak RV-RA gradient             18    mm Hg    ---------    Systemic veins                            Value          Reference  Estimated CVP                             3     mm Hg    ---------    Right ventricle                           Value          Reference  RV pressure, S, DP  21    mm Hg    <=30  RV s&', lateral, S                         16.9  cm/s     ---------  Legend: (L)  and  (H)  mark values outside specified reference range.  ------------------------------------------------------------------- Prepared and Electronically Authenticated by  Kirk Ruths 2019-04-25T15:44:56    RIGHT HEART CATH AND CORONARY ANGIOGRAPHY  Conclusion     Mid LAD lesion is 25% stenosed. Nonobstructive CAD.  Hemodynamic findings consistent with normal pulmonary artery pressures.  CO 5.3 L/min; CI 2.6; Ao sat 97%, PA sat 71%; PA pressure 26/10, mean 16 mm Hg; mean PCWP 9 mm Hg  Severe aortic stenosis known from echocardiogram.   Continue with plans for TAVR w/u.  Continue preventive therapy.      Indications   Severe aortic stenosis [I35.0 (ICD-10-CM)]  Procedural Details/Technique   Technical Details The risks, benefits, and details of the procedure were explained to the patient. The patient verbalized understanding and wanted to proceed. Informed written consent was obtained.  PROCEDURE TECHNIQUE: After Xylocaine anesthesia a 50F slender sheath was placed in the right antecubital area in exchange for a peripheral IV. After Xylocaine anesthesia a 50F slender sheath was placed in the right radial artery with a single anterior needle wall stick. IV Heparin was given. Right coronary angiography was done using a Judkins R4 guide catheter. Left coronary angiography was done using a Judkins L3.5 guide catheter. Left ventriculography was not done. A wire did not cross easily. Given that the echo shows severe AS, further attempts to cross were not made. A TR band was used for hemostasis.  Contrast: 65 cc   Estimated blood loss <50 mL.  During this procedure the patient was administered the following to achieve and maintain moderate conscious sedation: Versed 1 mg, Fentanyl 25 mcg, while the patient's heart rate, blood pressure, and oxygen saturation were continuously monitored. The period of conscious sedation was 26 minutes, of which I was present face-to-face 100% of this time.  Complications   Complications documented before study signed (02/05/2018 1:44 PM EDT)    No complications were associated with this study.  Documented by Jettie Booze, MD - 02/05/2018 1:39 PM EDT    Coronary Findings   Diagnostic  Dominance: Right  Left Anterior Descending  Mid LAD lesion 25% stenosed  Mid LAD lesion is 25% stenosed.  Left Circumflex  Vessel is large. The vessel exhibits minimal luminal irregularities.  Right Coronary Artery  The vessel exhibits minimal luminal irregularities.  Intervention   No interventions have been documented.  Right Heart   Right Heart Pressures Hemodynamic findings consistent with pulmonary hypertension. CO 5.3 L/min; CI 2.6; Ao sat 97%, PA sat 71%; PA pressure 26/10, mean 16 mm Hg; mean PCWP 9 mm Hg  Left Heart   Aortic Valve The aortic valve is calcified.  Coronary Diagrams   Diagnostic Diagram       Implants    No implant documentation for this case.  MERGE Images   Show images for CARDIAC CATHETERIZATION   Link to Procedure Log   Procedure Log    Hemo Data    Most Recent Value  Fick Cardiac Output 5.31 L/min  Fick Cardiac Output Index 2.63 (L/min)/BSA  RA A Wave 4 mmHg  RA V Wave 3 mmHg  RA Mean 3 mmHg  RV Systolic Pressure 26 mmHg  RV Diastolic  Pressure -1 mmHg  RV EDP 1 mmHg  PA Systolic Pressure 26 mmHg  PA Diastolic Pressure 10 mmHg  PA Mean 16 mmHg  PW A Wave 13 mmHg  PW V Wave 7 mmHg  PW Mean 9 mmHg  AO Systolic Pressure 154 mmHg  AO Diastolic Pressure 62 mmHg  AO Mean 80 mmHg  QP/QS 1  TPVR Index 6.09 HRUI  TSVR Index 30.44 HRUI  PVR SVR Ratio 0.09  TPVR/TSVR Ratio 0.2      EKG: (01/15/2018) NSR w/ RBBB and LAFB, QRS 124 ms    STS Risk Calculator  Procedure: Isolated AVR CALCULATE   Risk of Mortality:  1.630% Renal Failure:  1.645% Permanent Stroke:  1.229% Prolonged Ventilation:  5.982% DSW Infection:  0.100% Reoperation:  4.007% Morbidity or Mortality:  10.903% Short Length of Stay:  35.024% Long Length of Stay:  4.962%    Impression:  Patient has stage D severe symptomatic aortic stenosis.  He describes stable  symptoms of exertional shortness of breath and chest tightness consistent with chronic diastolic congestive heart failure, New [place] Heart Association functional class II.  I have personally reviewed the patient's transthoracic echocardiogram and diagnostic cardiac catheterization.  Most recent echocardiogram performed September 10, 2017 reveals essentially critical aortic stenosis.  The aortic valve is trileaflet with severe calcification, thickening, and restricted leaflet mobility involving all 3 leaflets.  Peak velocity across aortic valve measured 5.5 m/s corresponding to mean transvalvular gradient estimated 76 mmHg.  Left ventricular systolic function remains normal.  Diagnostic cardiac catheterization reveals normal coronary artery anatomy with no significant coronary disease.  Right heart pressures were normal.    There is no question the patient needs aortic valve replacement.  Risks associated with conventional surgery would be relatively low although affected by the patient's advanced age and the presence of chronic kidney disease.  Under the circumstances, transcatheter aortic valve replacement might prove to be a better choice as long as CT angiography reveals reasonably favorable anatomical conditions, although there is a high likelihood that he would need permanent pacemaker implantation because baseline EKG reveals sinus rhythm with right bundle branch block and left anterior fascicular block.   Plan:  The patient and his wife were counseled at length regarding treatment alternatives for management of severe symptomatic aortic stenosis. Alternative approaches such as conventional aortic valve replacement, transcatheter aortic valve replacement, and continued medical therapy without intervention were compared and contrasted at length.  The risks associated with conventional surgical aortic valve replacement were discussed in detail, as were expectations for post-operative convalescence.  Issues  specific to transcatheter aortic valve replacement were discussed including questions about long term valve durability, the potential for paravalvular leak, possible increased risk of need for permanent pacemaker placement, and other technical complications related to the procedure itself.  Long-term prognosis with medical therapy was discussed. This discussion was placed in the context of the patient's own specific clinical presentation and past medical history.  All of their questions have been addressed.  The patient hopes to proceed with transcatheter aortic valve replacement as soon as possible.  He understands the high likelihood that permanent pacemaker implantation will be required.  As a next step he will undergo CT angiography to further evaluate the feasibility of transcatheter aortic valve replacement.  Assuming there are no significant contraindications we will tentatively plan to proceed with surgery on March 09, 2018.  We will contact Dr. Caryl Comes and members of the EP service to arrange for possible permanent pacemaker implantation if necessary  following TAVR.  Following the decision to proceed with transcatheter aortic valve replacement, a discussion has been held regarding what types of management strategies would be attempted intraoperatively in the event of life-threatening complications, including whether or not the patient would be considered a candidate for the use of cardiopulmonary bypass and/or conversion to open sternotomy for attempted surgical intervention.  The patient has been advised of a variety of complications that might develop including but not limited to risks of death, stroke, paravalvular leak, aortic dissection or other major vascular complications, aortic annulus rupture, device embolization, cardiac rupture or perforation, mitral regurgitation, acute myocardial infarction, arrhythmia, heart block or bradycardia requiring permanent pacemaker placement, congestive heart  failure, respiratory failure, renal failure, pneumonia, infection, other late complications related to structural valve deterioration or migration, or other complications that might ultimately cause a temporary or permanent loss of functional independence or other long term morbidity.  The patient provides full informed consent for the procedure as described and all questions were answered.   I spent in excess of 90 minutes during the conduct of this office consultation and >50% of this time involved direct face-to-face encounter with the patient for counseling and/or coordination of their care.     Valentina Gu. Roxy Manns, MD 03/01/2018 9:27 AM

## 2018-03-01 NOTE — Patient Instructions (Addendum)
  Avoid any strenuous physical exertion until after your aortic valve has been replaced  Continue taking all current medications without change through the day before surgery.  Have nothing to eat or drink after midnight the night before surgery.  On the morning of surgery take only amlodipine (Norvasc) with a sip of water.

## 2018-03-02 ENCOUNTER — Ambulatory Visit: Payer: Medicare HMO | Attending: Thoracic Surgery (Cardiothoracic Vascular Surgery) | Admitting: Physical Therapy

## 2018-03-02 ENCOUNTER — Ambulatory Visit (HOSPITAL_COMMUNITY): Payer: Medicare HMO

## 2018-03-02 ENCOUNTER — Ambulatory Visit (HOSPITAL_COMMUNITY)
Admission: RE | Admit: 2018-03-02 | Discharge: 2018-03-02 | Disposition: A | Discharge status: Home / Self Care | Payer: Medicare HMO | Source: Ambulatory Visit | Attending: Thoracic Surgery (Cardiothoracic Vascular Surgery) | Admitting: Thoracic Surgery (Cardiothoracic Vascular Surgery)

## 2018-03-02 ENCOUNTER — Encounter (HOSPITAL_COMMUNITY): Payer: Self-pay

## 2018-03-02 ENCOUNTER — Encounter: Payer: Self-pay | Admitting: Physical Therapy

## 2018-03-02 ENCOUNTER — Other Ambulatory Visit: Payer: Self-pay

## 2018-03-02 DIAGNOSIS — R2689 Other abnormalities of gait and mobility: Secondary | ICD-10-CM | POA: Diagnosis not present

## 2018-03-02 DIAGNOSIS — I350 Nonrheumatic aortic (valve) stenosis: Secondary | ICD-10-CM

## 2018-03-02 DIAGNOSIS — R293 Abnormal posture: Secondary | ICD-10-CM | POA: Diagnosis not present

## 2018-03-02 DIAGNOSIS — I358 Other nonrheumatic aortic valve disorders: Secondary | ICD-10-CM | POA: Diagnosis not present

## 2018-03-02 DIAGNOSIS — I700 Atherosclerosis of aorta: Secondary | ICD-10-CM | POA: Diagnosis not present

## 2018-03-02 DIAGNOSIS — I2510 Atherosclerotic heart disease of native coronary artery without angina pectoris: Secondary | ICD-10-CM | POA: Insufficient documentation

## 2018-03-02 DIAGNOSIS — K4090 Unilateral inguinal hernia, without obstruction or gangrene, not specified as recurrent: Secondary | ICD-10-CM | POA: Diagnosis not present

## 2018-03-02 DIAGNOSIS — K429 Umbilical hernia without obstruction or gangrene: Secondary | ICD-10-CM | POA: Diagnosis not present

## 2018-03-02 MED ORDER — METOPROLOL TARTRATE 5 MG/5ML IV SOLN
5.0000 mg | INTRAVENOUS | Status: DC | PRN
  Administered 2018-03-02: 5 mg via INTRAVENOUS
  Filled 2018-03-02: qty 5

## 2018-03-02 MED ORDER — METOPROLOL TARTRATE 5 MG/5ML IV SOLN
INTRAVENOUS | Status: AC
  Administered 2018-03-02: 5 mg
  Filled 2018-03-02: qty 15

## 2018-03-02 MED ORDER — IOPAMIDOL (ISOVUE-370) INJECTION 76%
100.0000 mL | Freq: Once | INTRAVENOUS | Status: AC | PRN
  Administered 2018-03-02: 100 mL via INTRAVENOUS

## 2018-03-02 NOTE — Therapy (Signed)
[place], [place], [address] Phone: [phone]   Fax:  [phone]  Physical Therapy Evaluation  Patient Details  Name: Robert Tapia MRN: [id number] Date of Birth: 1931-08-26 Referring Provider (PT): Dr. Darylene Price   Encounter Date: 03/02/2018  PT End of Session - 03/02/18 1444    Visit Number  1    PT Start Time  1157    PT Stop Time  1505    PT Time Calculation (min)  33 min       Past Medical History:  Diagnosis Date  . Bifascicular bundle branch block    RBBB and LAFB  . Chronic renal disease    Referred Dr. Marval Regal 08/2009  . Essential hypertension, benign   . HTN (hypertension)   . Hyperlipidemia    Pt. declines cholesterol medication (07/03/09)  . Left ventricular hypertrophy    Moderate concentric LV hypertrophy by ECHO 11/05/12  . Lower extremity edema    Kidney cleanse has helped.  . Observation for suspected cardiovascular disease   . Pure hypercholesterolemia    LDL 164   . Severe aortic stenosis     Past Surgical History:  Procedure Laterality Date  . ELBOW SURGERY Right     There were no vitals filed for this visit.   Subjective Assessment - 03/02/18 1435    Subjective  Pt reports still active but has experienced some chest tightness and exertional shortness of breath with moderate or strenuous activities. He first noticed about 6-8 years ago and progressively worsened significantly over the past 3 years.     Patient Stated Goals  to fix heart and stay active    Currently in Pain?  No/denies         [facility] PT Assessment - 03/02/18 0001      Assessment   Medical Diagnosis  severe aortic stenosis    Referring Provider (PT)  Dr. Darylene Price    Onset Date/Surgical Date  01/31/18   approximate     Precautions   Precautions  None      Restrictions   Weight Bearing Restrictions  No      Balance Screen   Has the patient fallen in the past 6 months  No    Has the patient had a decrease in activity level because of a fear of falling?   No    Is the patient reluctant to leave their home because of a fear of falling?   No      Home Film/video editor residence    Living Arrangements  Spouse/significant other    Home Access  Level entry    Home Layout  Two level   reports no difficulty wiht stairs     Prior Function   Level of Independence  Independent with community mobility without device   limited with heavy activities at this time     Posture/Postural Control   Posture/Postural Control  Postural limitations    Postural Limitations  Forward head;Rounded Shoulders   mild to moderate     ROM / Strength   AROM / PROM / Strength  AROM;Strength      AROM   Overall AROM Comments  grossly WFL      Strength   Overall Strength Comments  grossly 5/5 throughout    Strength Assessment Site  Hand    Right/Left hand  Right;Left    Right Hand Grip (lbs)  65   R  hand dominant   Left Hand Grip (lbs)  62      Ambulation/Gait   Gait Comments  No significant deviations. Gait distance limited by 35% for age/gender.        [facility] Pre-Surgical Assessment - 03/02/18 0001    5 Meter Walk Test- trial 1  4 sec    5 Meter Walk Test- trial 2  4 sec.     5 Meter Walk Test- trial 3  5 sec.    5 meter walk test average  4.33 sec    4 Stage Balance Test tolerated for:   2 sec.    4 Stage Balance Test Position  4    Sit To Stand Test- trial 1  11 sec.    ADL/IADL Independent with:  Bathing;Dressing;Meal prep;Finances;Yard work    6 Minute Walk- Baseline  yes    BP (mmHg)  122/66    HR (bpm)  73    02 Sat (%RA)  94 %    Modified Borg Scale for Dyspnea  0- Nothing at all    Perceived Rate of Exertion (Borg)  6-    6 Minute Walk Post Test  yes    BP (mmHg)  122/71    HR (bpm)  102    02 Sat (%RA)  95 %    Modified Borg Scale for Dyspnea  0.5- Very, very slight shortness of breath    Perceived Rate of Exertion (Borg)  11-  Fairly light    Aerobic Endurance Distance Walked  1120              Objective measurements completed on examination: See above findings.                           Plan - 03/02/18 1505    Clinical Impression Statement  see below    PT Frequency  One time visit      Clinical Impression Statement: Pt is an 84 yo male presenting to OP PT for evaluation prior to possible TAVR surgery due to severe aortic stenosis. Pt reports onset of exertional chest tightness and shortness of breath several years ago with significant worsening in last 3 years. Pt still able to do most everything but with slower pace. Pt presents with good ROM and strength, good balance and is not at high fall risk 4 stage balance test, good walking speed and fair aerobic endurance per 6 minute walk test. Pt ambulated a total of 1120 feet in 6 minute walk. Based on the Short Physical Performance Battery, patient has a frailty rating of 12/12 with </= 5/12 considered frail.   Patient demonstrated the following deficits and impairments:     Visit Diagnosis: Other abnormalities of gait and mobility  Abnormal posture     Problem List Patient Active Problem List   Diagnosis Date Noted  . Bifascicular bundle branch block   . Overweight 11/08/2014  . Severe aortic stenosis   . Observation for suspected cardiovascular disease   . Essential hypertension, benign   . Pure hypercholesterolemia   . Left ventricular hypertrophy     Adelayde Minney, PT 03/02/2018, 3:05 PM  [facility] [address] [place], [place], [address] Phone: [phone]   Fax:  [phone]  Name: Robert Tapia MRN: [id number] Date of Birth: [birth year]

## 2018-03-03 ENCOUNTER — Other Ambulatory Visit: Payer: Self-pay

## 2018-03-03 DIAGNOSIS — I350 Nonrheumatic aortic (valve) stenosis: Secondary | ICD-10-CM

## 2018-03-04 NOTE — Pre-Procedure Instructions (Signed)
Robert Tapia  03/04/2018      [facility] [place], [address] [place] [address] Phone: [phone] Fax: [phone]    Your procedure is scheduled on Tuesday October 22.  Report to [facility] Admitting at 10:15 A.M.  Call this number if you have problems the morning of surgery:  [phone]   Remember:  Do not eat or drink after midnight.    Take these medicines the morning of surgery with A SIP OF WATER: Amlodipine (Norvasc)  7 days prior to surgery STOP taking any Aleve, Naproxen, Ibuprofen, Motrin, Advil, Goody's, BC's, all herbal medications, fish oil, and all vitamins     Do not wear jewelry  Do not wear lotions, powders, or colognes, or deodorant.  Do not shave 48 hours prior to surgery.  Men may shave face and neck.  Do not bring valuables to the hospital.  [facility] is not responsible for any belongings or valuables.  Contacts, dentures or bridgework may not be worn into surgery.  Leave your suitcase in the car.  After surgery it may be brought to your room.  For patients admitted to the hospital, discharge time will be determined by your treatment team.  Patients discharged the day of surgery will not be allowed to drive home.    Special instructions:    - Preparing For Surgery  Before surgery, you can play an important role. Because skin is not sterile, your skin needs to be as free of germs as possible. You can reduce the number of germs on your skin by washing with CHG (chlorahexidine gluconate) Soap before surgery.  CHG is an antiseptic cleaner which kills germs and bonds with the skin to continue killing germs even after washing.    Oral Hygiene is also important to reduce your risk of infection.  Remember - BRUSH YOUR TEETH THE MORNING OF SURGERY WITH YOUR REGULAR TOOTHPASTE  Please do not use if you have an allergy to CHG or antibacterial soaps. If your skin  becomes reddened/irritated stop using the CHG.  Do not shave (including legs and underarms) for at least 48 hours prior to first CHG shower. It is OK to shave your face.  Please follow these instructions carefully.   1. Shower the NIGHT BEFORE SURGERY and the MORNING OF SURGERY with CHG.   2. If you chose to wash your hair, wash your hair first as usual with your normal shampoo.  3. After you shampoo, rinse your hair and body thoroughly to remove the shampoo.  4. Use CHG as you would any other liquid soap. You can apply CHG directly to the skin and wash gently with a scrungie or a clean washcloth.   5. Apply the CHG Soap to your body ONLY FROM THE NECK DOWN.  Do not use on open wounds or open sores. Avoid contact with your eyes, ears, mouth and genitals (private parts). Wash Face and genitals (private parts)  with your normal soap.  6. Wash thoroughly, paying special attention to the area where your surgery will be performed.  7. Thoroughly rinse your body with warm water from the neck down.  8. DO NOT shower/wash with your normal soap after using and rinsing off the CHG Soap.  9. Pat yourself dry with a CLEAN TOWEL.  10. Wear CLEAN PAJAMAS to bed the night before surgery, wear comfortable clothes the morning of surgery  11. Place CLEAN SHEETS on  your bed the night of your first shower and DO NOT SLEEP WITH PETS.    Day of Surgery:  Do not apply any deodorants/lotions.  Please wear clean clothes to the hospital/surgery center.   Remember to brush your teeth WITH YOUR REGULAR TOOTHPASTE.    Please read over the following fact sheets that you were given. Coughing and Deep Breathing, MRSA Information and Surgical Site Infection Prevention

## 2018-03-05 ENCOUNTER — Other Ambulatory Visit: Payer: Self-pay

## 2018-03-05 ENCOUNTER — Ambulatory Visit (HOSPITAL_COMMUNITY)
Admission: RE | Admit: 2018-03-05 | Discharge: 2018-03-05 | Disposition: A | Discharge status: Home / Self Care | Payer: Medicare HMO | Source: Ambulatory Visit | Attending: Cardiovascular Disease | Admitting: Cardiovascular Disease

## 2018-03-05 ENCOUNTER — Ambulatory Visit (HOSPITAL_COMMUNITY)
Admission: RE | Admit: 2018-03-05 | Discharge: 2018-03-05 | Disposition: A | Discharge status: Home / Self Care | Payer: Medicare HMO | Source: Ambulatory Visit | Attending: Thoracic Surgery (Cardiothoracic Vascular Surgery) | Admitting: Thoracic Surgery (Cardiothoracic Vascular Surgery)

## 2018-03-05 ENCOUNTER — Encounter (HOSPITAL_COMMUNITY)
Admission: RE | Admit: 2018-03-05 | Discharge: 2018-03-05 | Disposition: A | Discharge status: Home / Self Care | Payer: Medicare HMO | Source: Ambulatory Visit | Attending: Cardiovascular Disease | Admitting: Cardiovascular Disease

## 2018-03-05 ENCOUNTER — Encounter (HOSPITAL_COMMUNITY): Payer: Self-pay

## 2018-03-05 DIAGNOSIS — I350 Nonrheumatic aortic (valve) stenosis: Secondary | ICD-10-CM | POA: Insufficient documentation

## 2018-03-05 DIAGNOSIS — J449 Chronic obstructive pulmonary disease, unspecified: Secondary | ICD-10-CM | POA: Diagnosis not present

## 2018-03-05 DIAGNOSIS — I452 Bifascicular block: Secondary | ICD-10-CM | POA: Diagnosis not present

## 2018-03-05 DIAGNOSIS — Z01818 Encounter for other preprocedural examination: Secondary | ICD-10-CM | POA: Insufficient documentation

## 2018-03-05 DIAGNOSIS — Z01812 Encounter for preprocedural laboratory examination: Secondary | ICD-10-CM | POA: Insufficient documentation

## 2018-03-05 DIAGNOSIS — Z01810 Encounter for preprocedural cardiovascular examination: Secondary | ICD-10-CM | POA: Diagnosis not present

## 2018-03-05 DIAGNOSIS — R918 Other nonspecific abnormal finding of lung field: Secondary | ICD-10-CM | POA: Diagnosis not present

## 2018-03-05 LAB — URINALYSIS, ROUTINE W REFLEX MICROSCOPIC
Bilirubin Urine: NEGATIVE
GLUCOSE, UA: NEGATIVE mg/dL
Hgb urine dipstick: NEGATIVE
Ketones, ur: NEGATIVE mg/dL
Nitrite: POSITIVE — AB
PROTEIN: 100 mg/dL — AB
Specific Gravity, Urine: 1.017 (ref 1.005–1.030)
pH: 6 (ref 5.0–8.0)

## 2018-03-05 LAB — BLOOD GAS, ARTERIAL
ACID-BASE EXCESS: 0.5 mmol/L (ref 0.0–2.0)
Bicarbonate: 24.8 mmol/L (ref 20.0–28.0)
Drawn by: 470591
FIO2: 21
O2 Saturation: 97.2 %
PH ART: 7.398 (ref 7.350–7.450)
Patient temperature: 98.6
pCO2 arterial: 41 mmHg (ref 32.0–48.0)
pO2, Arterial: 95.4 mmHg (ref 83.0–108.0)

## 2018-03-05 LAB — COMPREHENSIVE METABOLIC PANEL
ALK PHOS: 50 U/L (ref 38–126)
ALT: 18 U/L (ref 0–44)
AST: 19 U/L (ref 15–41)
Albumin: 3.3 g/dL — ABNORMAL LOW (ref 3.5–5.0)
Anion gap: 8 (ref 5–15)
BUN: 24 mg/dL — ABNORMAL HIGH (ref 8–23)
CALCIUM: 9.4 mg/dL (ref 8.9–10.3)
CO2: 20 mmol/L — AB (ref 22–32)
CREATININE: 1.49 mg/dL — AB (ref 0.61–1.24)
Chloride: 111 mmol/L (ref 98–111)
GFR, EST AFRICAN AMERICAN: 48 mL/min — AB (ref >60)
GFR, EST NON AFRICAN AMERICAN: 41 mL/min — AB (ref >60)
Glucose, Bld: 102 mg/dL — ABNORMAL HIGH (ref 70–99)
Potassium: 4.6 mmol/L (ref 3.5–5.1)
Sodium: 139 mmol/L (ref 135–145)
Total Bilirubin: 0.4 mg/dL (ref 0.3–1.2)
Total Protein: 7.9 g/dL (ref 6.5–8.1)

## 2018-03-05 LAB — PULMONARY FUNCTION TEST
DL/VA % PRED: 121 %
DL/VA: 5.55 ml/min/mmHg/L
DLCO cor % pred: 70 %
DLCO cor: 22.84 ml/min/mmHg
DLCO unc % pred: 69 %
DLCO unc: 22.45 ml/min/mmHg
FEF 25-75 Post: 3.21 L/sec
FEF 25-75 Pre: 3.46 L/sec
FEF2575-%Change-Post: -7 %
FEF2575-%Pred-Post: 181 %
FEF2575-%Pred-Pre: 195 %
FEV1-%CHANGE-POST: 0 %
FEV1-%PRED-PRE: 84 %
FEV1-%Pred-Post: 84 %
FEV1-POST: 2.3 L
FEV1-Pre: 2.3 L
FEV1FVC-%Change-Post: 0 %
FEV1FVC-%Pred-Pre: 123 %
FEV6-%Change-Post: 0 %
FEV6-%Pred-Post: 72 %
FEV6-%Pred-Pre: 73 %
FEV6-POST: 2.61 L
FEV6-Pre: 2.62 L
FEV6FVC-%Change-Post: 0 %
FEV6FVC-%PRED-POST: 107 %
FEV6FVC-%PRED-PRE: 107 %
FVC-%CHANGE-POST: 0 %
FVC-%PRED-POST: 67 %
FVC-%PRED-PRE: 68 %
FVC-POST: 2.61 L
FVC-PRE: 2.62 L
POST FEV6/FVC RATIO: 100 %
PRE FEV1/FVC RATIO: 88 %
PRE FEV6/FVC RATIO: 100 %
Post FEV1/FVC ratio: 88 %
RV % pred: 46 %
RV: 1.27 L
TLC % PRED: 59 %
TLC: 4.21 L

## 2018-03-05 LAB — SURGICAL PCR SCREEN
MRSA, PCR: NEGATIVE
Staphylococcus aureus: POSITIVE — AB

## 2018-03-05 LAB — CBC
HEMATOCRIT: 44.2 % (ref 39.0–52.0)
HEMOGLOBIN: 14 g/dL (ref 13.0–17.0)
MCH: 29.5 pg (ref 26.0–34.0)
MCHC: 31.7 g/dL (ref 30.0–36.0)
MCV: 93.2 fL (ref 80.0–100.0)
Platelets: 182 10*3/uL (ref 150–400)
RBC: 4.74 MIL/uL (ref 4.22–5.81)
RDW: 13.4 % (ref 11.5–15.5)
WBC: 9.8 10*3/uL (ref 4.0–10.5)
nRBC: 0 % (ref 0.0–0.2)

## 2018-03-05 LAB — BRAIN NATRIURETIC PEPTIDE: B Natriuretic Peptide: 126.2 pg/mL — ABNORMAL HIGH (ref 0.0–100.0)

## 2018-03-05 LAB — TYPE AND SCREEN
ABO/RH(D): A POS
ANTIBODY SCREEN: NEGATIVE

## 2018-03-05 LAB — HEMOGLOBIN A1C
Hgb A1c MFr Bld: 5.4 % (ref 4.8–5.6)
MEAN PLASMA GLUCOSE: 108.28 mg/dL

## 2018-03-05 LAB — PROTIME-INR
INR: 1.19
Prothrombin Time: 15 seconds (ref 11.4–15.2)

## 2018-03-05 LAB — ABO/RH: ABO/RH(D): A POS

## 2018-03-05 LAB — APTT: APTT: 32 s (ref 24–36)

## 2018-03-05 MED ORDER — ALBUTEROL SULFATE (2.5 MG/3ML) 0.083% IN NEBU
2.5000 mg | INHALATION_SOLUTION | Freq: Once | RESPIRATORY_TRACT | Status: AC
  Administered 2018-03-05: 2.5 mg via RESPIRATORY_TRACT

## 2018-03-05 MED ORDER — CIPROFLOXACIN HCL 500 MG PO TABS: 500.0000 mg | ORAL_TABLET | Freq: Two times a day (BID) | ORAL | 0 refills | Status: DC

## 2018-03-05 NOTE — Progress Notes (Signed)
PCP - Dr. Radene Ou Cardiologist - [name] Nephrologist- Dr. Marval Regal  Chest x-ray - 03/05/2018  EKG - 03/05/2018  ECHO - 09/10/17 Cardiac Cath - 02/05/18  Anesthesia review: EKG with LAFB  Patient denies shortness of breath, fever, cough and chest pain at PAT appointment   Patient verbalized understanding of instructions that were given to them at the PAT appointment. Patient was also instructed that they will need to review over the PAT instructions again at home before surgery.

## 2018-03-05 NOTE — Progress Notes (Signed)
Preliminary notes---Pre TAVR evaluation carotid duplex exam completed.   Bilateral vertebral arteries are patent with antegrade flow. No significant hemodynamic stenosis bilateral ICAs, according to the velocities.  Lorina Rabon (RDMS RVT) 03/05/18 11:33 AM

## 2018-03-05 NOTE — Progress Notes (Signed)
UA resulted from PAT testing and this was abnormal.  I spoke with the pt and he does complain of burning with urination.  The pt states that he had a UTI this Summer and Dr Marval Regal prescribed medication for treatment.  I discussed with Ellie Lunch and order given for Cipro 500mg  take one tablet by mouth twice a day. Rx sent to the pharmacy and pt aware.

## 2018-03-05 NOTE — Progress Notes (Signed)
I sent message to Theodosia Quay TAVR nurse regarding U/A results

## 2018-03-08 MED ORDER — EPINEPHRINE PF 1 MG/ML IJ SOLN
0.0000 ug/min | INTRAVENOUS | Status: DC
  Filled 2018-03-08: qty 4

## 2018-03-08 MED ORDER — SODIUM CHLORIDE 0.9 % IV SOLN
1.5000 g | INTRAVENOUS | Status: AC
  Administered 2018-03-09: 1.5 g via INTRAVENOUS
  Filled 2018-03-08: qty 1.5

## 2018-03-08 MED ORDER — TRANEXAMIC ACID 1000 MG/10ML IV SOLN
1.5000 mg/kg/h | INTRAVENOUS | Status: DC
  Filled 2018-03-08: qty 25

## 2018-03-08 MED ORDER — SODIUM CHLORIDE 0.9 % IV SOLN
750.0000 mg | INTRAVENOUS | Status: DC
  Filled 2018-03-08: qty 750

## 2018-03-08 MED ORDER — INSULIN REGULAR(HUMAN) IN NACL 100-0.9 UT/100ML-% IV SOLN
INTRAVENOUS | Status: DC
  Filled 2018-03-08: qty 100

## 2018-03-08 MED ORDER — POTASSIUM CHLORIDE 2 MEQ/ML IV SOLN
80.0000 meq | INTRAVENOUS | Status: DC
  Filled 2018-03-08: qty 40

## 2018-03-08 MED ORDER — PLASMA-LYTE 148 IV SOLN
INTRAVENOUS | Status: DC
  Filled 2018-03-08: qty 2.5

## 2018-03-08 MED ORDER — TRANEXAMIC ACID (OHS) BOLUS VIA INFUSION
15.0000 mg/kg | INTRAVENOUS | Status: DC
  Filled 2018-03-08: qty 1320

## 2018-03-08 MED ORDER — MAGNESIUM SULFATE 50 % IJ SOLN
40.0000 meq | INTRAMUSCULAR | Status: DC
  Filled 2018-03-08: qty 9.85

## 2018-03-08 MED ORDER — DEXMEDETOMIDINE HCL IN NACL 400 MCG/100ML IV SOLN
0.1000 ug/kg/h | INTRAVENOUS | Status: AC
  Administered 2018-03-09: 1 ug/kg/h via INTRAVENOUS
  Filled 2018-03-08: qty 100

## 2018-03-08 MED ORDER — SODIUM CHLORIDE 0.9 % IV SOLN
INTRAVENOUS | Status: DC
  Filled 2018-03-08: qty 30

## 2018-03-08 MED ORDER — TRANEXAMIC ACID (OHS) PUMP PRIME SOLUTION
2.0000 mg/kg | INTRAVENOUS | Status: DC
  Filled 2018-03-08: qty 1.76

## 2018-03-08 MED ORDER — NITROGLYCERIN IN D5W 200-5 MCG/ML-% IV SOLN
2.0000 ug/min | INTRAVENOUS | Status: DC
  Filled 2018-03-08: qty 250

## 2018-03-08 MED ORDER — MILRINONE LACTATE IN DEXTROSE 20-5 MG/100ML-% IV SOLN
0.3000 ug/kg/min | INTRAVENOUS | Status: DC
  Filled 2018-03-08: qty 100

## 2018-03-08 MED ORDER — PHENYLEPHRINE HCL-NACL 20-0.9 MG/250ML-% IV SOLN
30.0000 ug/min | INTRAVENOUS | Status: DC
  Filled 2018-03-08: qty 250

## 2018-03-08 MED ORDER — VANCOMYCIN HCL 10 G IV SOLR
1500.0000 mg | INTRAVENOUS | Status: AC
  Administered 2018-03-09: 1500 mg via INTRAVENOUS
  Filled 2018-03-08: qty 1500

## 2018-03-08 MED ORDER — DOPAMINE-DEXTROSE 3.2-5 MG/ML-% IV SOLN
0.0000 ug/kg/min | INTRAVENOUS | Status: DC
  Filled 2018-03-08: qty 250

## 2018-03-09 ENCOUNTER — Inpatient Hospital Stay (HOSPITAL_COMMUNITY)
Admission: RE | Admit: 2018-03-09 | Discharge: 2018-03-11 | DRG: 267 | Disposition: A | Discharge status: Home / Self Care | Payer: Medicare HMO | Attending: Cardiovascular Disease | Admitting: Cardiovascular Disease

## 2018-03-09 ENCOUNTER — Encounter (HOSPITAL_COMMUNITY)
Admission: RE | Disposition: A | Discharge status: Home / Self Care | Payer: Self-pay | Source: Home / Self Care | Attending: Cardiovascular Disease

## 2018-03-09 ENCOUNTER — Inpatient Hospital Stay (HOSPITAL_COMMUNITY): Payer: Medicare HMO

## 2018-03-09 ENCOUNTER — Encounter (HOSPITAL_COMMUNITY): Payer: Self-pay

## 2018-03-09 ENCOUNTER — Inpatient Hospital Stay (HOSPITAL_COMMUNITY): Payer: Medicare HMO | Admitting: Physician Assistant

## 2018-03-09 ENCOUNTER — Other Ambulatory Visit: Payer: Self-pay

## 2018-03-09 ENCOUNTER — Inpatient Hospital Stay (HOSPITAL_COMMUNITY): Payer: Medicare HMO | Admitting: Certified Registered Nurse Anesthetist

## 2018-03-09 DIAGNOSIS — I272 Pulmonary hypertension, unspecified: Secondary | ICD-10-CM | POA: Diagnosis not present

## 2018-03-09 DIAGNOSIS — E785 Hyperlipidemia, unspecified: Secondary | ICD-10-CM | POA: Diagnosis not present

## 2018-03-09 DIAGNOSIS — Z006 Encounter for examination for normal comparison and control in clinical research program: Secondary | ICD-10-CM

## 2018-03-09 DIAGNOSIS — E663 Overweight: Secondary | ICD-10-CM | POA: Diagnosis not present

## 2018-03-09 DIAGNOSIS — N183 Chronic kidney disease, stage 3 unspecified: Secondary | ICD-10-CM

## 2018-03-09 DIAGNOSIS — Z8249 Family history of ischemic heart disease and other diseases of the circulatory system: Secondary | ICD-10-CM | POA: Diagnosis not present

## 2018-03-09 DIAGNOSIS — Z79899 Other long term (current) drug therapy: Secondary | ICD-10-CM | POA: Diagnosis not present

## 2018-03-09 DIAGNOSIS — I350 Nonrheumatic aortic (valve) stenosis: Principal | ICD-10-CM

## 2018-03-09 DIAGNOSIS — I444 Left anterior fascicular block: Secondary | ICD-10-CM | POA: Diagnosis present

## 2018-03-09 DIAGNOSIS — J9811 Atelectasis: Secondary | ICD-10-CM | POA: Diagnosis not present

## 2018-03-09 DIAGNOSIS — E7800 Pure hypercholesterolemia, unspecified: Secondary | ICD-10-CM | POA: Diagnosis not present

## 2018-03-09 DIAGNOSIS — I5030 Unspecified diastolic (congestive) heart failure: Secondary | ICD-10-CM | POA: Diagnosis not present

## 2018-03-09 DIAGNOSIS — I517 Cardiomegaly: Secondary | ICD-10-CM | POA: Diagnosis present

## 2018-03-09 DIAGNOSIS — Z952 Presence of prosthetic heart valve: Secondary | ICD-10-CM | POA: Diagnosis not present

## 2018-03-09 DIAGNOSIS — Z6829 Body mass index (BMI) 29.0-29.9, adult: Secondary | ICD-10-CM

## 2018-03-09 DIAGNOSIS — I10 Essential (primary) hypertension: Secondary | ICD-10-CM | POA: Diagnosis present

## 2018-03-09 DIAGNOSIS — I129 Hypertensive chronic kidney disease with stage 1 through stage 4 chronic kidney disease, or unspecified chronic kidney disease: Secondary | ICD-10-CM | POA: Diagnosis present

## 2018-03-09 DIAGNOSIS — I452 Bifascicular block: Secondary | ICD-10-CM | POA: Diagnosis present

## 2018-03-09 HISTORY — DX: Presence of prosthetic heart valve: Z95.2

## 2018-03-09 HISTORY — PX: INTRAOPERATIVE TRANSTHORACIC ECHOCARDIOGRAM: SHX6523

## 2018-03-09 HISTORY — PX: TRANSCATHETER AORTIC VALVE REPLACEMENT, TRANSFEMORAL: SHX6400

## 2018-03-09 LAB — POCT I-STAT, CHEM 8
BUN: 22 mg/dL (ref 8–23)
BUN: 22 mg/dL (ref 8–23)
Calcium, Ion: 1.27 mmol/L (ref 1.15–1.40)
Calcium, Ion: 1.26 mmol/L (ref 1.15–1.40)
Chloride: 105 mmol/L (ref 98–111)
Chloride: 107 mmol/L (ref 98–111)
Creatinine, Ser: 1.3 mg/dL — ABNORMAL HIGH (ref 0.61–1.24)
Creatinine, Ser: 1.3 mg/dL — ABNORMAL HIGH (ref 0.61–1.24)
Glucose, Bld: 108 mg/dL — ABNORMAL HIGH (ref 70–99)
Glucose, Bld: 141 mg/dL — ABNORMAL HIGH (ref 70–99)
HCT: 39 % (ref 39.0–52.0)
HEMATOCRIT: 39 % (ref 39.0–52.0)
HEMOGLOBIN: 13.3 g/dL (ref 13.0–17.0)
Hemoglobin: 13.3 g/dL (ref 13.0–17.0)
POTASSIUM: 4.4 mmol/L (ref 3.5–5.1)
Potassium: 4.2 mmol/L (ref 3.5–5.1)
SODIUM: 140 mmol/L (ref 135–145)
Sodium: 141 mmol/L (ref 135–145)
TCO2: 27 mmol/L (ref 22–32)
TCO2: 24 mmol/L (ref 22–32)

## 2018-03-09 LAB — POCT I-STAT 4, (NA,K, GLUC, HGB,HCT)
GLUCOSE: 137 mg/dL — AB (ref 70–99)
HEMATOCRIT: 39 % (ref 39.0–52.0)
Hemoglobin: 13.3 g/dL (ref 13.0–17.0)
Potassium: 4.3 mmol/L (ref 3.5–5.1)
Sodium: 141 mmol/L (ref 135–145)

## 2018-03-09 SURGERY — IMPLANTATION, AORTIC VALVE, TRANSCATHETER, FEMORAL APPROACH
Anesthesia: Monitor Anesthesia Care | Site: Chest

## 2018-03-09 MED ORDER — CHLORHEXIDINE GLUCONATE 0.12 % MT SOLN
15.0000 mL | Freq: Once | OROMUCOSAL | Status: AC
  Administered 2018-03-09: 15 mL via OROMUCOSAL
  Filled 2018-03-09: qty 15

## 2018-03-09 MED ORDER — NOREPINEPHRINE 4 MG/250ML-% IV SOLN
0.0000 ug/min | INTRAVENOUS | Status: DC
  Filled 2018-03-09 (×2): qty 250

## 2018-03-09 MED ORDER — SODIUM CHLORIDE 0.9 % IV SOLN
250.0000 mL | INTRAVENOUS | Status: DC | PRN
  Administered 2018-03-10: 250 mL via INTRAVENOUS

## 2018-03-09 MED ORDER — ONDANSETRON HCL 4 MG/2ML IJ SOLN: 4.0000 mg | Freq: Four times a day (QID) | INTRAMUSCULAR | Status: DC | PRN

## 2018-03-09 MED ORDER — PHENYLEPHRINE HCL-NACL 20-0.9 MG/250ML-% IV SOLN
0.0000 ug/min | INTRAVENOUS | Status: DC
  Administered 2018-03-09: 20 ug/min via INTRAVENOUS
  Filled 2018-03-09 (×2): qty 250

## 2018-03-09 MED ORDER — SODIUM CHLORIDE 0.9% FLUSH
3.0000 mL | Freq: Two times a day (BID) | INTRAVENOUS | Status: DC
  Administered 2018-03-09 – 2018-03-11 (×4): 3 mL via INTRAVENOUS

## 2018-03-09 MED ORDER — SODIUM CHLORIDE 0.9 % IV SOLN
INTRAVENOUS | Status: AC
  Filled 2018-03-09 (×3): qty 1.2

## 2018-03-09 MED ORDER — VANCOMYCIN HCL IN DEXTROSE 1-5 GM/200ML-% IV SOLN
1000.0000 mg | Freq: Once | INTRAVENOUS | Status: AC
  Administered 2018-03-09: 1000 mg via INTRAVENOUS
  Filled 2018-03-09: qty 200

## 2018-03-09 MED ORDER — NITROGLYCERIN IN D5W 200-5 MCG/ML-% IV SOLN: 0.0000 ug/min | INTRAVENOUS | Status: DC

## 2018-03-09 MED ORDER — PROTAMINE SULFATE 10 MG/ML IV SOLN
INTRAVENOUS | Status: DC | PRN
  Administered 2018-03-09: 130 mg via INTRAVENOUS

## 2018-03-09 MED ORDER — SODIUM CHLORIDE 0.9 % IV SOLN
INTRAVENOUS | Status: AC
  Administered 2018-03-09: 16:00:00 via INTRAVENOUS

## 2018-03-09 MED ORDER — CHLORHEXIDINE GLUCONATE 4 % EX LIQD: 30.0000 mL | CUTANEOUS | Status: DC

## 2018-03-09 MED ORDER — HEPARIN SODIUM (PORCINE) 1000 UNIT/ML IJ SOLN
INTRAMUSCULAR | Status: DC | PRN
  Administered 2018-03-09: 13000 [IU] via INTRAVENOUS

## 2018-03-09 MED ORDER — LACTATED RINGERS IV SOLN
INTRAVENOUS | Status: DC
  Administered 2018-03-09 (×2): via INTRAVENOUS

## 2018-03-09 MED ORDER — MIDAZOLAM HCL 2 MG/2ML IJ SOLN
INTRAMUSCULAR | Status: AC
  Administered 2018-03-09: 1 mg via INTRAVENOUS
  Filled 2018-03-09: qty 2

## 2018-03-09 MED ORDER — SODIUM CHLORIDE 0.9% FLUSH: 3.0000 mL | INTRAVENOUS | Status: DC | PRN

## 2018-03-09 MED ORDER — FENTANYL CITRATE (PF) 250 MCG/5ML IJ SOLN
INTRAMUSCULAR | Status: AC
  Filled 2018-03-09: qty 5

## 2018-03-09 MED ORDER — FENTANYL CITRATE (PF) 100 MCG/2ML IJ SOLN: 25.0000 ug | INTRAMUSCULAR | Status: DC | PRN

## 2018-03-09 MED ORDER — IODIXANOL 320 MG/ML IV SOLN
INTRAVENOUS | Status: DC | PRN
  Administered 2018-03-09: 19.9 mL via INTRAVENOUS

## 2018-03-09 MED ORDER — PROTAMINE SULFATE 10 MG/ML IV SOLN
INTRAVENOUS | Status: AC
  Filled 2018-03-09: qty 25

## 2018-03-09 MED ORDER — CLOPIDOGREL BISULFATE 75 MG PO TABS
75.0000 mg | ORAL_TABLET | Freq: Every day | ORAL | Status: DC
  Administered 2018-03-10 – 2018-03-11 (×2): 75 mg via ORAL
  Filled 2018-03-09 (×2): qty 1

## 2018-03-09 MED ORDER — NOREPINEPHRINE BITARTRATE 1 MG/ML IV SOLN
INTRAVENOUS | Status: DC | PRN
  Administered 2018-03-09: 3 ug/min via INTRAVENOUS

## 2018-03-09 MED ORDER — HEPARIN SODIUM (PORCINE) 1000 UNIT/ML IJ SOLN
INTRAMUSCULAR | Status: AC
  Filled 2018-03-09: qty 1

## 2018-03-09 MED ORDER — ACETAMINOPHEN 650 MG RE SUPP: 650.0000 mg | Freq: Four times a day (QID) | RECTAL | Status: DC | PRN

## 2018-03-09 MED ORDER — ASPIRIN 81 MG PO CHEW
81.0000 mg | CHEWABLE_TABLET | Freq: Every day | ORAL | Status: DC
  Administered 2018-03-10 – 2018-03-11 (×2): 81 mg via ORAL
  Filled 2018-03-09 (×2): qty 1

## 2018-03-09 MED ORDER — TRAMADOL HCL 50 MG PO TABS: 50.0000 mg | ORAL_TABLET | ORAL | Status: DC | PRN

## 2018-03-09 MED ORDER — ONDANSETRON HCL 4 MG/2ML IJ SOLN
INTRAMUSCULAR | Status: DC | PRN
  Administered 2018-03-09: 4 mg via INTRAVENOUS

## 2018-03-09 MED ORDER — OXYCODONE HCL 5 MG/5ML PO SOLN: 5.0000 mg | Freq: Once | ORAL | Status: DC | PRN

## 2018-03-09 MED ORDER — LIDOCAINE HCL (PF) 1 % IJ SOLN
INTRAMUSCULAR | Status: DC | PRN
  Administered 2018-03-09: 4 mL

## 2018-03-09 MED ORDER — ACETAMINOPHEN 325 MG PO TABS: 650.0000 mg | ORAL_TABLET | Freq: Four times a day (QID) | ORAL | Status: DC | PRN

## 2018-03-09 MED ORDER — MORPHINE SULFATE (PF) 2 MG/ML IV SOLN: 2.0000 mg | INTRAVENOUS | Status: DC | PRN

## 2018-03-09 MED ORDER — METOPROLOL TARTRATE 5 MG/5ML IV SOLN: 2.5000 mg | INTRAVENOUS | Status: DC | PRN

## 2018-03-09 MED ORDER — CHLORHEXIDINE GLUCONATE 4 % EX LIQD: 60.0000 mL | Freq: Once | CUTANEOUS | Status: DC

## 2018-03-09 MED ORDER — NOREPINEPHRINE 4 MG/250ML-% IV SOLN
0.0000 ug/min | INTRAVENOUS | Status: DC
  Filled 2018-03-09: qty 250

## 2018-03-09 MED ORDER — SODIUM CHLORIDE 0.9 % IV SOLN
INTRAVENOUS | Status: DC | PRN
  Administered 2018-03-09: 1500 mL

## 2018-03-09 MED ORDER — DEXMEDETOMIDINE HCL 200 MCG/2ML IV SOLN
INTRAVENOUS | Status: DC | PRN
  Administered 2018-03-09: 88 ug via INTRAVENOUS

## 2018-03-09 MED ORDER — PROPOFOL 500 MG/50ML IV EMUL
INTRAVENOUS | Status: DC | PRN
  Administered 2018-03-09: 10 ug/kg/min via INTRAVENOUS

## 2018-03-09 MED ORDER — SODIUM CHLORIDE 0.9 % IV SOLN
1.5000 g | Freq: Two times a day (BID) | INTRAVENOUS | Status: AC
  Administered 2018-03-09 – 2018-03-11 (×4): 1.5 g via INTRAVENOUS
  Filled 2018-03-09 (×4): qty 1.5

## 2018-03-09 MED ORDER — ORAL CARE MOUTH RINSE
15.0000 mL | Freq: Two times a day (BID) | OROMUCOSAL | Status: DC
  Administered 2018-03-09: 15 mL via OROMUCOSAL

## 2018-03-09 MED ORDER — OXYCODONE HCL 5 MG PO TABS: 5.0000 mg | ORAL_TABLET | ORAL | Status: DC | PRN

## 2018-03-09 MED ORDER — MIDAZOLAM HCL 2 MG/2ML IJ SOLN
1.0000 mg | Freq: Once | INTRAMUSCULAR | Status: AC
  Administered 2018-03-09: 1 mg via INTRAVENOUS

## 2018-03-09 MED ORDER — FENTANYL CITRATE (PF) 100 MCG/2ML IJ SOLN
INTRAMUSCULAR | Status: AC
  Administered 2018-03-09: 50 ug via INTRAVENOUS
  Filled 2018-03-09: qty 2

## 2018-03-09 MED ORDER — LIDOCAINE HCL 1 % IJ SOLN
INTRAMUSCULAR | Status: AC
  Filled 2018-03-09: qty 20

## 2018-03-09 MED ORDER — SODIUM CHLORIDE 0.9 % IV SOLN: INTRAVENOUS | Status: DC

## 2018-03-09 MED ORDER — OXYCODONE HCL 5 MG PO TABS: 5.0000 mg | ORAL_TABLET | Freq: Once | ORAL | Status: DC | PRN

## 2018-03-09 MED ORDER — FENTANYL CITRATE (PF) 100 MCG/2ML IJ SOLN
50.0000 ug | Freq: Once | INTRAMUSCULAR | Status: AC
  Administered 2018-03-09: 50 ug via INTRAVENOUS

## 2018-03-09 MED ORDER — PROPOFOL 10 MG/ML IV BOLUS
INTRAVENOUS | Status: DC | PRN
  Administered 2018-03-09 (×3): 10 mg via INTRAVENOUS

## 2018-03-09 SURGICAL SUPPLY — 93 items
BAG DECANTER FOR FLEXI CONT (MISCELLANEOUS) IMPLANT
BAG SNAP BAND KOVER 36X36 (MISCELLANEOUS) ×4 IMPLANT
BALLN TRUE 20X4.5 (BALLOONS) ×4
BALLOON TRUE 20X4.5 (BALLOONS) ×3 IMPLANT
BLADE CLIPPER SURG (BLADE) IMPLANT
BLADE STERNUM SYSTEM 6 (BLADE) ×4 IMPLANT
CABLE ADAPT CONN TEMP 6FT (ADAPTER) ×4 IMPLANT
CANISTER SUCT 3000ML PPV (MISCELLANEOUS) IMPLANT
CANNULA FEM VENOUS REMOTE 22FR (CANNULA) IMPLANT
CANNULA OPTISITE PERFUSION 16F (CANNULA) IMPLANT
CANNULA OPTISITE PERFUSION 18F (CANNULA) IMPLANT
CATH DIAG EXPO 6F VENT PIG 145 (CATHETERS) ×8 IMPLANT
CATH EXPO 5FR AL1 (CATHETERS) IMPLANT
CATH INFINITI 6F AL2 (CATHETERS) IMPLANT
CATH S G BIP PACING (SET/KITS/TRAYS/PACK) ×4 IMPLANT
CLIP VESOCCLUDE MED 24/CT (CLIP) ×4 IMPLANT
CLIP VESOCCLUDE SM WIDE 24/CT (CLIP) ×4 IMPLANT
CONT SPEC 4OZ CLIKSEAL STRL BL (MISCELLANEOUS) ×8 IMPLANT
COVER BACK TABLE 80X110 HD (DRAPES) IMPLANT
COVER DOME SNAP 22 D (MISCELLANEOUS) IMPLANT
COVER WAND RF STERILE (DRAPES) ×4 IMPLANT
CRADLE DONUT ADULT HEAD (MISCELLANEOUS) ×4 IMPLANT
DERMABOND ADHESIVE PROPEN (GAUZE/BANDAGES/DRESSINGS) ×2
DERMABOND ADVANCED (GAUZE/BANDAGES/DRESSINGS) ×1
DERMABOND ADVANCED .7 DNX12 (GAUZE/BANDAGES/DRESSINGS) ×3 IMPLANT
DERMABOND ADVANCED .7 DNX6 (GAUZE/BANDAGES/DRESSINGS) ×6 IMPLANT
DEVICE CLOSURE PERCLS PRGLD 6F (VASCULAR PRODUCTS) ×6 IMPLANT
DRAPE INCISE IOBAN 66X45 STRL (DRAPES) IMPLANT
DRSG TEGADERM 4X4.75 (GAUZE/BANDAGES/DRESSINGS) ×8 IMPLANT
ELECT CAUTERY BLADE 6.4 (BLADE) IMPLANT
ELECT REM PT RETURN 9FT ADLT (ELECTROSURGICAL) ×8
ELECTRODE REM PT RTRN 9FT ADLT (ELECTROSURGICAL) ×6 IMPLANT
FELT TEFLON 6X6 (MISCELLANEOUS) ×4 IMPLANT
FEMORAL VENOUS CANN RAP (CANNULA) IMPLANT
GAUZE SPONGE 4X4 12PLY STRL (GAUZE/BANDAGES/DRESSINGS) ×4 IMPLANT
GLOVE BIO SURGEON STRL SZ7.5 (GLOVE) IMPLANT
GLOVE BIO SURGEON STRL SZ8 (GLOVE) IMPLANT
GLOVE EUDERMIC 7 POWDERFREE (GLOVE) IMPLANT
GLOVE ORTHO TXT STRL SZ7.5 (GLOVE) IMPLANT
GOWN STRL REUS W/ TWL LRG LVL3 (GOWN DISPOSABLE) IMPLANT
GOWN STRL REUS W/ TWL XL LVL3 (GOWN DISPOSABLE) ×3 IMPLANT
GOWN STRL REUS W/TWL LRG LVL3 (GOWN DISPOSABLE)
GOWN STRL REUS W/TWL XL LVL3 (GOWN DISPOSABLE) ×1
GUIDEWIRE SAF TJ AMPL .035X180 (WIRE) ×4 IMPLANT
GUIDEWIRE SAFE TJ AMPLATZ EXST (WIRE) ×4 IMPLANT
GUIDEWIRE STRAIGHT .035 260CM (WIRE) ×4 IMPLANT
INSERT FOGARTY SM (MISCELLANEOUS) IMPLANT
KIT BASIN OR (CUSTOM PROCEDURE TRAY) ×4 IMPLANT
KIT DILATOR VASC 18G NDL (KITS) IMPLANT
KIT HEART LEFT (KITS) ×4 IMPLANT
KIT SUCTION CATH 14FR (SUCTIONS) ×8 IMPLANT
KIT TURNOVER KIT B (KITS) ×4 IMPLANT
LOOP VESSEL MAXI BLUE (MISCELLANEOUS) IMPLANT
LOOP VESSEL MINI RED (MISCELLANEOUS) IMPLANT
NEEDLE PERC 18GX7CM (NEEDLE) ×4 IMPLANT
NS IRRIG 1000ML POUR BTL (IV SOLUTION) ×4 IMPLANT
PACK ENDOVASCULAR (PACKS) ×4 IMPLANT
PAD ARMBOARD 7.5X6 YLW CONV (MISCELLANEOUS) ×8 IMPLANT
PAD ELECT DEFIB RADIOL ZOLL (MISCELLANEOUS) ×4 IMPLANT
PENCIL BUTTON HOLSTER BLD 10FT (ELECTRODE) IMPLANT
PERCLOSE PROGLIDE 6F (VASCULAR PRODUCTS) ×8
SET MICROPUNCTURE 5F STIFF (MISCELLANEOUS) ×4 IMPLANT
SHEATH BRITE TIP 6FR 35CM (SHEATH) ×4 IMPLANT
SHEATH FAST CATH 12F 12CM (SHEATH) ×4 IMPLANT
SHEATH PINNACLE 6F 10CM (SHEATH) IMPLANT
SHEATH PINNACLE 8F 10CM (SHEATH) ×4 IMPLANT
SLEEVE REPOSITIONING LENGTH 30 (MISCELLANEOUS) ×4 IMPLANT
SPONGE LAP 4X18 RFD (DISPOSABLE) ×4 IMPLANT
STOPCOCK MORSE 400PSI 3WAY (MISCELLANEOUS) ×12 IMPLANT
SUT ETHIBOND X763 2 0 SH 1 (SUTURE) IMPLANT
SUT GORETEX CV 4 TH 22 36 (SUTURE) IMPLANT
SUT GORETEX CV4 TH-18 (SUTURE) IMPLANT
SUT MNCRL AB 3-0 PS2 18 (SUTURE) IMPLANT
SUT PROLENE 5 0 C 1 36 (SUTURE) IMPLANT
SUT PROLENE 6 0 C 1 30 (SUTURE) IMPLANT
SUT SILK  1 MH (SUTURE) ×1
SUT SILK 1 MH (SUTURE) ×3 IMPLANT
SUT SILK 2 0 PERMA HAND 18 BK (SUTURE) ×4 IMPLANT
SUT VIC AB 2-0 CT1 27 (SUTURE)
SUT VIC AB 2-0 CT1 TAPERPNT 27 (SUTURE) IMPLANT
SUT VIC AB 2-0 CTX 36 (SUTURE) IMPLANT
SUT VIC AB 3-0 SH 8-18 (SUTURE) IMPLANT
SYR 50ML LL SCALE MARK (SYRINGE) ×4 IMPLANT
SYR BULB IRRIGATION 50ML (SYRINGE) IMPLANT
SYR CONTROL 10ML LL (SYRINGE) IMPLANT
TAPE CLOTH SURG 4X10 WHT LF (GAUZE/BANDAGES/DRESSINGS) ×4 IMPLANT
TOWEL GREEN STERILE (TOWEL DISPOSABLE) ×8 IMPLANT
TRANSDUCER W/STOPCOCK (MISCELLANEOUS) ×8 IMPLANT
TRAY FOLEY SLVR 14FR TEMP STAT (SET/KITS/TRAYS/PACK) IMPLANT
TUBE SUCT INTRACARD DLP 20F (MISCELLANEOUS) IMPLANT
VALVE HEART TRANSCATH SZ3 26MM (Prosthesis & Implant Heart) ×4 IMPLANT
WIRE .035 3MM-J 145CM (WIRE) ×4 IMPLANT
WIRE BENTSON .035X145CM (WIRE) ×4 IMPLANT

## 2018-03-09 NOTE — Progress Notes (Signed)
  [place] VALVE TEAM  Patient doing well s/p TAVR. He is hemodynamically stable. Groin sites stable. Temp wire in place but turned off. ECG not completed yet. Tele with no high grade block. Few short runs of NSVT. Continue to monitor closely. If no conduction issues, plan to remove temp wire tomorrow and transfer out of ICU.    Angelena Form PA-C  [facility]  Pager [phone]

## 2018-03-09 NOTE — Progress Notes (Signed)
Patient ID: Robert Tapia, male   DOB: 02-23-1932, 84 y.o.   MRN: [id number] T[facility] Evening Rounds:  Hemodynamically stable Sinus high 40's to 50's. No heart block. Has temporary pacer wire in. Sleeping.

## 2018-03-09 NOTE — Anesthesia Procedure Notes (Signed)
Arterial Line Insertion Start/End10/22/2019 12:00 PM, 03/09/2018 12:15 PM Performed by: Lavell Luster, CRNA, CRNA  Preanesthetic checklist: patient identified, IV checked, site marked, risks and benefits discussed, surgical consent, monitors and equipment checked, pre-op evaluation and timeout performed Lidocaine 1% used for infiltration Right, radial was placed Catheter size: 20 G Hand hygiene performed , maximum sterile barriers used  and Seldinger technique used Allen's test indicative of satisfactory collateral circulation Attempts: 1 Procedure performed without using ultrasound guided technique. Following insertion, dressing applied and Biopatch. Post procedure assessment: normal  Patient tolerated the procedure well with no immediate complications.

## 2018-03-09 NOTE — Progress Notes (Signed)
Patient interviewed in the preop area. Patient able to confirm name, DOB, procedure, NKDA, no metal, npo status and no pain. Family at bedside.   Leatha Gilding, RN

## 2018-03-09 NOTE — Op Note (Signed)
[facility]   MULTIDISCIPLINARY HEART VALVE TEAM   TAVR OPERATIVE NOTE  Date of Procedure:  03/09/2018  Preoperative Diagnosis: Severe Aortic Stenosis   Postoperative Diagnosis: Same   Procedure:    Transcatheter Aortic Valve Replacement - Percutaneous Transfemoral Approach  Edwards Sapien 3 THV (size 26 mm, model # 9600TFX, serial # [id number])   Co-Surgeons:  Valentina Gu. Roxy Manns, MD, MD and Sherren Mocha, MD  Anesthesiologist:  Albertha Ghee, MD  Echocardiographer:  Sanda Klein, MD  Pre-operative Echo Findings:  Critical aortic stenosis  Normal left ventricular systolic function  Post-operative Echo Findings:  1-2+ paravalvular leak  Normal left ventricular systolic function  BRIEF CLINICAL NOTE AND INDICATIONS FOR SURGERY  84 yo male with critical, Stage D1 aortic stenosis presenting for TAVR. He has no significant CAD, and has undergone preoperative studies demonstrating suitable anatomy for TAVR via a transfemoral approach. He does have significant conduction disease with bifascicular block and understands the increased risk of high grade AV block and permanent pacing.   During the course of the patient's preoperative work up they have been evaluated comprehensively by a multidisciplinary team of specialists coordinated through the [facility] in the [facility].  They have been demonstrated to suffer from symptomatic severe aortic stenosis as noted above. The patient has been counseled extensively as to the relative risks and benefits of all options for the treatment of severe aortic stenosis including long term medical therapy, conventional surgery for aortic valve replacement, and transcatheter aortic valve replacement.  The patient has been independently evaluated by Dr Roxy Manns and felt to be at moderate risk for conventional surgical aortic valve replacement based upon a predicted risk of mortality.  Based  upon review of all of the patient's preoperative diagnostic tests they are felt to be candidate for transcatheter aortic valve replacement using the transfemoral approach as an alternative to high risk conventional surgery.    Following the decision to proceed with transcatheter aortic valve replacement, a discussion has been held regarding what types of management strategies would be attempted intraoperatively in the event of life-threatening complications, including whether or not the patient would be considered a candidate for the use of cardiopulmonary bypass and/or conversion to open sternotomy for attempted surgical intervention.  The patient has been advised of a variety of complications that might develop peculiar to this approach including but not limited to risks of death, stroke, paravalvular leak, aortic dissection or other major vascular complications, aortic annulus rupture, device embolization, cardiac rupture or perforation, acute myocardial infarction, arrhythmia, heart block or bradycardia requiring permanent pacemaker placement, congestive heart failure, respiratory failure, renal failure, pneumonia, infection, other late complications related to structural valve deterioration or migration, or other complications that might ultimately cause a temporary or permanent loss of functional independence or other long term morbidity.  The patient provides full informed consent for the procedure as described and all questions were answered preoperatively.  DETAILS OF THE OPERATIVE PROCEDURE  PREPARATION:   The patient is brought to the operating room on the above mentioned date and central monitoring was established by the anesthesia team including placement of a central venous catheter and radial arterial line. The patient is placed in the supine position on the operating table.  Intravenous antibiotics are administered. The patient is monitored closely throughout the procedure under conscious  sedation.  Baseline transthoracic echocardiogram is performed. The patient's chest, abdomen, both groins, and both lower extremities are prepared and draped in a  sterile manner. A time out procedure is performed.  PERIPHERAL ACCESS:   Using ultrasound guidance, femoral arterial and venous access is obtained with placement of 6 Fr sheaths on the right side.  A pigtail diagnostic catheter was passed through the femoral arterial sheath under fluoroscopic guidance into the aortic root.  A temporary transvenous pacemaker catheter was passed through the femoral venous sheath under fluoroscopic guidance into the right ventricle.  The pacemaker was tested to ensure stable lead placement and pacemaker capture. Aortic root angiography was performed in order to determine the optimal angiographic angle for valve deployment.  TRANSFEMORAL ACCESS:  A micropuncture technique is used to access the left femoral artery under fluoroscopic and ultrasound guidance.  2 Perclose devices are deployed at 10' and 2' positions to 'PreClose' the femoral artery. An 8 French sheath is placed and then an Amplatz Superstiff wire is advanced through the sheath. This is changed out for a 14 French transfemoral E-Sheath after progressively dilating over the Superstiff wire.  An AL-2 catheter was used to direct a straight-tip exchange length wire across the native aortic valve into the left ventricle. This was exchanged out for a pigtail catheter and position was confirmed in the LV apex. Simultaneous LV and Ao pressures were recorded.  The pigtail catheter was exchanged for an Amplatz Extra-stiff wire in the LV apex.  Echocardiography was utilized to confirm appropriate wire position and no sign of entanglement in the mitral subvalvular apparatus.  BALLOON AORTIC VALVULOPLASTY:  Balloon aortic valvuloplasty was performed using a 20 mm valvuloplasty balloon.  Once optimal position was achieved, BAV was done under rapid ventricular pacing.  The patient recovered well hemodynamically.   TRANSCATHETER HEART VALVE DEPLOYMENT:  An Edwards Sapien 3 transcatheter heart valve (size 26 mm, model #9600TFX, serial #[id number]) was prepared and crimped per manufacturer's guidelines, and the proper orientation of the valve is confirmed on the [employer] delivery system. The valve was advanced through the introducer sheath using normal technique until in an appropriate position in the abdominal aorta beyond the sheath tip. The balloon was then retracted and using the fine-tuning wheel was centered on the valve. The valve was then advanced across the aortic arch using appropriate flexion of the catheter. Despite a prolonged attempt with catheter manipulation, the valve would not cross the aortic anulus. 2 cc of saline/contrast mix were introduced into the balloon but this did not facilitate passage of the valve across the annulus. At that point, we decided to redilate the native valve. Double Preclose of the right femoral arteriotomy is performed and the indwelling 6 Fr sheath is changed out for a 12 Fr sheath. The aortic valve is crossed with an AL 2 catheter and straight wire and this is then changed out for a pigtail catheter. A Confida wire is then introduced into the LV. The Sapien valve is retracted into the ascending aorta away from the aortic valve. Balloon valvuloplasty is performed with a 20 mm True balloon under rapid pacing and the patient recovers well. The balloon is then retracted and the Sapien 3 valve is successfully advanced across the native aortic valve annulus. The valve was carefully positioned across the aortic valve annulus. The Commander catheter was retracted using normal technique. Once final position of the valve has been confirmed by angiographic assessment, the valve is deployed while temporarily holding ventilation and during rapid ventricular pacing to maintain systolic blood pressure < 50 mmHg and pulse pressure < 10 mmHg. The  balloon inflation is held for >3  seconds after reaching full deployment volume. Once the balloon has fully deflated the balloon is retracted into the ascending aorta and valve function is assessed using echocardiography. There is felt to be moderate paravalvular leak and no central aortic insufficiency. Therefore, an additional 2 cc of contrast is administered into the balloon and a post-dilatation is performed under rapid pacing.  The patient's hemodynamic recovery following valve deployment is good.  The deployment balloon and guidewire are both removed. Echo demostrated acceptable post-procedural gradients, stable mitral valve function, and 1-2+ aortic insufficiency.    PROCEDURE COMPLETION:  Both arterial sheaths were removed and femoral artery closure is performed using the 2 previously deployed Perclose devices.  Protamine is administered once femoral arterial repair was complete. The sites are clear with no evidence of bleeding or hematoma after the sutures are tightened. The temporary pacemaker is left in place because of pre-existing conduction system disease.   The patient tolerated the procedure well and is transported to the surgical intensive care in stable condition. There were no immediate intraoperative complications. All sponge instrument and needle counts are verified correct at completion of the operation.   The patient received a total of 20 mL of intravenous contrast during the procedure.   Sherren Mocha, MD 03/09/2018 3:43 PM

## 2018-03-09 NOTE — Anesthesia Preprocedure Evaluation (Signed)
Anesthesia Evaluation  Patient identified by MRN, date of birth, ID band Patient awake    Reviewed: Allergy & Precautions, H&P , NPO status , Patient's Chart, lab work & pertinent test results  Airway Mallampati: II   Neck ROM: full    Dental   Pulmonary shortness of breath,    breath sounds clear to auscultation       Cardiovascular hypertension, + Valvular Problems/Murmurs AS  Rhythm:regular Rate:Normal  Severe AS. EF 65-70%   Neuro/Psych    GI/Hepatic   Endo/Other    Renal/GU Renal InsufficiencyRenal disease     Musculoskeletal   Abdominal   Peds  Hematology   Anesthesia Other Findings   Reproductive/Obstetrics                             Anesthesia Physical Anesthesia Plan  ASA: III  Anesthesia Plan: MAC   Post-op Pain Management:    Induction: Intravenous  PONV Risk Score and Plan: 1 and Ondansetron, Propofol infusion and Treatment may vary due to age or medical condition  Airway Management Planned: Simple Face Mask  Additional Equipment: Arterial line, CVP and Ultrasound Guidance Line Placement  Intra-op Plan:   Post-operative Plan:   Informed Consent: I have reviewed the patients History and Physical, chart, labs and discussed the procedure including the risks, benefits and alternatives for the proposed anesthesia with the patient or authorized representative who has indicated his/her understanding and acceptance.     Plan Discussed with: CRNA, Anesthesiologist and Surgeon  Anesthesia Plan Comments:         Anesthesia Quick Evaluation

## 2018-03-09 NOTE — Anesthesia Procedure Notes (Signed)
Central Venous Catheter Insertion Performed by: Albertha Ghee, MD, anesthesiologist Start/End10/22/2019 12:20 PM, 03/09/2018 12:30 PM Patient location: Pre-op. Preanesthetic checklist: patient identified, IV checked, site marked, risks and benefits discussed, surgical consent, monitors and equipment checked, pre-op evaluation, timeout performed and anesthesia consent Position: Trendelenburg Lidocaine 1% used for infiltration and patient sedated Hand hygiene performed , maximum sterile barriers used  and Seldinger technique used Catheter size: 7 Fr Central line was placed.Double lumen Procedure performed using ultrasound guided technique. Ultrasound Notes:anatomy identified, needle tip was noted to be adjacent to the nerve/plexus identified and no ultrasound evidence of intravascular and/or intraneural injection Attempts: 1 Following insertion, line sutured, dressing applied and Biopatch. Post procedure assessment: blood return through all ports, free fluid flow and no air  Patient tolerated the procedure well with no immediate complications.

## 2018-03-09 NOTE — Transfer of Care (Signed)
Immediate Anesthesia Transfer of Care Note  Patient: Robert Tapia  Procedure(s) Performed: TRANSCATHETER AORTIC VALVE REPLACEMENT, TRANSFEMORAL (N/A Chest) INTRAOPERATIVE TRANSTHORACIC ECHOCARDIOGRAM (Chest)  Patient Location: PACU and Cath Lab  Anesthesia Type:MAC  Level of Consciousness: awake and alert   Airway & Oxygen Therapy: Patient Spontanous Breathing and Patient connected to nasal cannula oxygen  Post-op Assessment: Report given to RN and Post -op Vital signs reviewed and stable  Post vital signs: Reviewed and stable  Last Vitals:  Vitals Value Taken Time  BP 120/69 03/09/2018  3:49 PM  Temp    Pulse 63 03/09/2018  3:59 PM  Resp 19 03/09/2018  3:59 PM  SpO2 98 % 03/09/2018  3:59 PM  Vitals shown include unvalidated device data.  Last Pain:  Vitals:   03/09/18 1042  TempSrc:   PainSc: 0-No pain      Patients Stated Pain Goal: 2 (82/50/03 7048)  Complications: No apparent anesthesia complications

## 2018-03-09 NOTE — Op Note (Addendum)
[facility]   MULTIDISCIPLINARY HEART VALVE TEAM   TAVR OPERATIVE NOTE   Date of Procedure:  03/09/2018  Preoperative Diagnosis: Severe Aortic Stenosis   Postoperative Diagnosis: Same   Procedure:    Transcatheter Aortic Valve Replacement - Percutaneous Left Transfemoral Approach  Edwards Sapien 3 THV (size 26 mm, model # 9600TFX, serial # [id number])   Co-Surgeons:  Valentina Gu. Roxy Manns, MD and Sherren Mocha, MD  Anesthesiologist:  Albertha Ghee, MD  Echocardiographer:  Sanda Klein, MD  Pre-operative Echo Findings:  Severe aortic stenosis  Normal left ventricular systolic function  Post-operative Echo Findings:  Moderate paravalvular leak  Unchanged left ventricular systolic function     BRIEF CLINICAL NOTE AND INDICATIONS FOR SURGERY  Patient is an 84 year old male with known history of severe symptomatic aortic stenosis, hypertension, stage III chronic kidney disease, right bundle branch block with left anterior fascicular block, and hyperlipidemia who has been referred for surgical consultation to discuss treatment options for management of critical aortic stenosis.  Patient has long-standing history of heart murmur dating back to childhood and has been followed for several years by [name] with known history of aortic stenosis.  Serial echocardiograms have demonstrated gradual progression and severity of aortic stenosis.  Patient has claimed to remain essentially asymptomatic until recently.  He was previously referred for surgical consultation in 2015.  At that time echocardiogram suggested the presence of severe aortic stenosis with peak velocity across the aortic valve measured 4.5 m/s corresponding to mean transvalvular gradient estimated 48 mmHg.  Though the patient stated that he had no symptoms at that time, he did relatively poorly with an exercise treadmill stress test.  I had the opportunity to see the patient in consultation at that time,  but because of his continued good functional status the patient decided not to proceed with elective surgical intervention.  Since then he has been treated with chelation therapy in effort to dissolve any calcium in his bloodstream.  He has continued to follow-up regularly with [name] who saw him most recently on August 20, 2017.  Follow-up echocardiogram performed at that time revealed essentially critical aortic stenosis with peak velocity across aortic valve measured 5.5 m/s corresponding to mean transvalvular gradient estimated 76 mmHg.  The DVI was reported 0.21 and aortic valve area calculated 0.67 cm.  Left ventricular systolic function remains normal with ejection fraction estimated 65 to 70%.  The patient was encouraged to proceed with diagnostic catheterization and further work-up for possible surgical intervention, but again delayed.  More recently the patient was seen in consultation by Dr. Caryl Comes as a second opinion who also encouraged the patient can consider surgical intervention.  The patient subsequently underwent diagnostic cardiac catheterization by [name] February 05, 2018.  This revealed normal coronary artery anatomy with no significant coronary artery disease and normal right heart pressures.  Cardiothoracic surgical consultation was requested.  During the course of the patient's preoperative work up they have been evaluated comprehensively by a multidisciplinary team of specialists coordinated through the [facility] in the [facility].  They have been demonstrated to suffer from symptomatic severe aortic stenosis as noted above. The patient has been counseled extensively as to the relative risks and benefits of all options for the treatment of severe aortic stenosis including long term medical therapy, conventional surgery for aortic valve replacement, and transcatheter aortic valve replacement.  All questions have been  answered, and the patient provides full informed consent  for the operation as described.   DETAILS OF THE OPERATIVE PROCEDURE  PREPARATION:    The patient is brought to the operating room on the above mentioned date and central monitoring was established by the anesthesia team including placement of a central venous line and radial arterial line. The patient is placed in the supine position on the operating table.  Intravenous antibiotics are administered. The patient is monitored closely throughout the procedure under conscious sedation.  Baseline transthoracic echocardiogram was performed. The patient's chest, abdomen, both groins, and both lower extremities are prepared and draped in a sterile manner. A time out procedure is performed.   PERIPHERAL ACCESS:    Using the modified Seldinger technique, femoral arterial and venous access was obtained with placement of 6 Fr sheaths on the right side.  A pigtail diagnostic catheter was passed through the right arterial sheath under fluoroscopic guidance into the aortic root.  A temporary transvenous pacemaker catheter was passed through the right femoral venous sheath under fluoroscopic guidance into the right ventricle.  The pacemaker was tested to ensure stable lead placement and pacemaker capture. Aortic root angiography was performed in order to determine the optimal angiographic angle for valve deployment.   TRANSFEMORAL ACCESS:   Percutaneous transfemoral access and sheath placement was performed by Dr. Burt Knack using ultrasound guidance.  The left common femoral artery was cannulated using a micropuncture needle and appropriate location was verified using hand injection angiogram.  A pair of [employer] Perclose percutaneous closure devices were placed and a 6 French sheath replaced into the femoral artery.  The patient was heparinized systemically and ACT verified > 250 seconds.    A 14 Fr transfemoral E-sheath was introduced into the left common  femoral artery after progressively dilating over an Amplatz superstiff wire. An AL-2 catheter was used to direct a straight-tip exchange length wire across the native aortic valve into the left ventricle. This was exchanged out for a pigtail catheter and position was confirmed in the LV apex. Simultaneous LV and Ao pressures were recorded.  The pigtail catheter was exchanged for an Amplatz Extra-stiff wire in the LV apex.  Echocardiography was utilized to confirm appropriate wire position and no sign of entanglement in the mitral subvalvular apparatus.   BALLOON AORTIC VALVULOPLASTY:   Balloon aortic valvuloplasty was performed using a 20 mm valvuloplasty balloon.  Once optimal position was achieved, BAV was done under rapid ventricular pacing. The patient recovered well hemodynamically.    TRANSCATHETER HEART VALVE DEPLOYMENT:   An Edwards Sapien 3 transcatheter heart valve (size 26 mm, model #9600TFX, serial #[id number]) was prepared and crimped per manufacturer's guidelines, and the proper orientation of the valve is confirmed on the [employer] delivery system. The valve was advanced through the introducer sheath using normal technique until in an appropriate position in the abdominal aorta beyond the sheath tip. The balloon was then retracted and using the fine-tuning wheel was centered on the valve. The valve was then advanced across the aortic arch using appropriate flexion of the catheter.  Attempts to advance the valve over the guidewire through the aortic valve were initially unsuccessful.  There remains severe stenosis with bulky calcification in the valve.  Several maneuvers were employed including advancing the nose cone through the valve and then adding 2 mL of saline to the delivery balloon.  The entire delivery system was rotated and several attempts were made, all unsuccessful.  The diagnostic pigtail catheter placed from the right femoral artery was subsequently removed.  The 6  French sheath in the right common femoral artery was removed over a guidewire and 2 Perclose devices placed.  The sheath was replaced using a 12 French sheath placed over an Amplatz superstiff wire.  The superstiff wire was removed and an AL-2 catheter was used to direct a straight-tip exchange length wire across the native aortic valve into the left ventricle. This was exchanged out for a pigtail catheter and position was confirmed in the LV apex.  A Confida wire was passed through the pigtail catheter and positioned into the left ventricle.  A 20 [place] True balloon valvuloplasty catheter was advanced over the guidewire and once optimal position was achieved, BAV was done under rapid ventricular pacing. The patient recovered well hemodynamically.  The BAV catheter was pulled back into the aortic arch while the Confida wire left in place in the left ventricle.  The valve was subsequently advanced and successfully crossed the aortic valve.  Once the valve was positioned appropriately and the aortic annulus the Confida wire was removed.  The Commander catheter was retracted using normal technique. Once final position of the valve has been confirmed by angiographic assessment, the valve is deployed while temporarily holding ventilation and during rapid ventricular pacing to maintain systolic blood pressure < 50 mmHg and pulse pressure < 10 mmHg. The balloon inflation is held for >3 seconds after reaching full deployment volume. Once the balloon has fully deflated the balloon is retracted into the ascending aorta and valve function is assessed using echocardiography. There is felt to be moderate paravalvular leak and no central aortic insufficiency.  The patient's hemodynamic recovery following valve deployment is good.    After careful assessment of the residual paravalvular leak a decision is made to proceed with post deployment balloon valvuloplasty.  2 mL of saline were added to the delivery system balloon and  the balloon recrossed in position within the valve.  Balloon valvuloplasty is performed under rapid pacing.  The deployment balloon and guidewire are both removed.  Follow-up echocardiography reveals normal left ventricular function with moderate paravalvular leak.   PROCEDURE COMPLETION:   The sheath was removed and both femoral artery closures performed by Dr Burt Knack.  Protamine was administered once femoral arterial repair was complete.  The temporary pacemaker wire and venous sheaths were left in place.  The patient tolerated the procedure well and is transported to the surgical intensive care in stable condition. There were no immediate intraoperative complications. All sponge instrument and needle counts are verified correct at completion of the operation.   No blood products were administered during the operation.  The patient received a total of 19.9 mL of intravenous contrast during the procedure.   Rexene Alberts, MD 03/09/2018 3:18 PM

## 2018-03-09 NOTE — H&P (Signed)
[facility]   MULTIDISCIPLINARY HEART VALVE CLINIC   HISTORY AND PHYSICAL EXAM  84 year old male with known history of severe symptomatic aortic stenosis, stage III chronic kidney disease, conduction system disease with right bundle branch block and left anterior fascicular block, and hypertension, presenting today for TAVR.  The patient has been followed for aortic stenosis for many years.  His serial echo studies have demonstrated gradual progression of aortic stenosis from the severe range back in 2015 now to a critical range where his peak systolic velocity is greater than 5.5 m/s and his mean transvalvular gradient is 76 mmHg.  His LV function remains normal with an ejection fraction of 65 to 70%.  After extensive discussion with both Dr. Illene Bolus and Dr. Caryl Comes, he agreed to seek further work-up for consideration of TAVR.  He has now undergone right and left heart catheterization demonstrating no significant coronary artery disease and normal right heart pressures.  CT angiogram studies have demonstrated suitable transfemoral access and cardiac anatomy for TAVR.  The patient has undergone formal surgical evaluation by Dr. Roxy Manns on March 01, 2018 and now presents for TAVR today.  From a symptomatic perspective, he reports an active lifestyle, with both exertional chest tightness and shortness of breath with moderate level activity.  He has no resting symptoms.  He specifically denies resting chest pain, shortness of breath, dizziness, heart palpitations, PND, or orthopnea.  He is able to do household chores and low-level activity without symptoms.  Based upon review of all of the patient's preoperative diagnostic tests they are felt to be candidate for transcatheter aortic valve replacement using the transfemoral approach as an alternative to conventional surgery.  The patient has been counseled at length regarding the indications, risks and potential benefits of surgery.  All  questions have been answered, and the patient now presents for surgery having provided full informed consent for the operation as planned.   Past Medical History:  Diagnosis Date  . Bifascicular bundle branch block    RBBB and LAFB  . Chronic renal disease    Referred Dr. Marval Regal 08/2009  . Dyspnea    minor with exertion d/t "valve problem"  . Essential hypertension, benign   . HTN (hypertension)   . Hyperlipidemia    Pt. declines cholesterol medication (07/03/09)  . Left ventricular hypertrophy    Moderate concentric LV hypertrophy by ECHO 11/05/12  . Lower extremity edema    Kidney cleanse has helped.  . Observation for suspected cardiovascular disease   . Pure hypercholesterolemia    LDL 164   . Severe aortic stenosis     Past Surgical History:  Procedure Laterality Date  . ELBOW SURGERY Right     Family History  Problem Relation Age of Onset  . Diabetes Mother   . Heart attack Brother        HALF brother had MI & died at advance    Social History Social History   Tobacco Use  . Smoking status: Never Smoker  . Smokeless tobacco: Never Used  Substance Use Topics  . Alcohol use: No  . Drug use: No    Prior to Admission medications   Medication Sig Start Date End Date Taking? Authorizing Provider  amLODipine (NORVASC) 10 MG tablet Take 10 mg by mouth every Monday, Wednesday, and Friday.    Yes [provider]  Cholecalciferol (VITAMIN D3) 5000 units CAPS Take 5,000 Units by mouth daily.   Yes [provider]  CRANBERRY PO Take 12,600 mg  by mouth daily.    Yes [provider]  lisinopril (PRINIVIL,ZESTRIL) 5 MG tablet Take 5 mg by mouth 4 (four) times a week.   Yes [provider]  Magnesium 125 MG CAPS Take 250 mg by mouth every evening.   Yes [provider]  Nutritional Supplements (DHEA PO) Take 45 mg by mouth daily.    Yes [provider]  OVER THE COUNTER MEDICATION Take 320 mg by mouth 2 (two) times  daily. OmegaPure    Yes [provider]  OVER THE COUNTER MEDICATION Take 1 tablet by mouth 2 (two) times daily. K2-7 Supplement   Yes [provider]  OVER THE COUNTER MEDICATION Take 1 tablet by mouth daily. Liver Protect Supplement   Yes [provider]  OVER THE COUNTER MEDICATION Take 1 capsule by mouth daily. Heart Saver Cholesterol Supplement    Yes [provider]  OVER THE COUNTER MEDICATION Take 1 tablet by mouth daily. Metabolic Synergy Supplement   Yes [provider]  OVER THE COUNTER MEDICATION Apply 1 application topically daily. Kelacream   Yes [provider]  vitamin C (ASCORBIC ACID) 500 MG tablet Take 1,000 mg by mouth daily.    Yes [provider]  ciprofloxacin (CIPRO) 500 MG tablet Take 1 tablet (500 mg total) by mouth 2 (two) times daily. 03/05/18   Eileen Stanford, PA-C    No Known Allergies   Review of Systems: General: no fevers/chills/night sweats/weight loss Eyes: no blurry vision, diplopia, or amaurosis ENT: no sore throat or hearing loss Resp: no cough, wheezing, or hemoptysis CV: no edema or palpitations GI: no abdominal pain, nausea, vomiting, diarrhea, or constipation GU: no dysuria, frequency, or hematuria Skin: no rash Neuro: no headache, numbness, tingling, or weakness of extremities Musculoskeletal: no joint pain or swelling Heme: no bleeding, DVT, or easy bruising Endo: no polydipsia or polyuria  Physical Exam: Pt is alert and oriented, WD, WN, in no distress. HEENT: normal Neck: JVP normal. Carotid upstrokes normal with bilateral bruits. No thyromegaly. Lungs: equal expansion, clear bilaterally CV: Apex is discrete and nondisplaced, RRR without murmur or gallop Abd: soft, NT, +BS, no bruit, no hepatosplenomegaly Back: no CVA tenderness Ext: no C/C/E        DP/PT pulses intact and = Skin: warm and dry without rash. Stasis changes both lower legs Neuro: CNII-XII intact              Strength intact = bilaterally  Diagnostic Tests: 2D Echo: Study Conclusions  - Left ventricle: The cavity size was normal. Wall thickness was increased in a pattern of mild LVH. Systolic function was vigorous. The estimated ejection fraction was in the range of 65% to 70%. Wall motion was normal; there were no regional wall motion abnormalities. Doppler parameters are consistent with abnormal left ventricular relaxation (grade 1 diastolic dysfunction). - Aortic valve: Valve mobility was restricted. There was critical stenosis. There was trivial regurgitation. Valve area (VTI): 0.75 cm^2. Valve area (Vmax): 0.67 cm^2. Valve area (Vmean): 0.55 cm^2. - Left atrium: The atrium was mildly dilated.  Impressions:  - Vigorous LV systolic function; mild LVH; mild diastolic dysfunction; calcified aortic valve with critical aortic stenosis (mean gradient 76 mmHg) and trace AI; mild LAE.  ------------------------------------------------------------------- Study data: Comparison was made to the study of 11/10/2014. Study status: Routine. Procedure: The patient reported no pain pre or post test. Transthoracic echocardiography. Image quality was adequate. Study completion: There were no complications. Transthoracic echocardiography. M-mode, complete 2D, spectral  Doppler, and color Doppler. Birthdate: Patient birthdate: 1931/09/27. Age: Patient is 84 yr old. Sex: Gender: male. BMI: 29.3 kg/m^2. Blood pressure: 111/71 Patient status: Outpatient. Study date: Study date: 09/10/2017. Study time: 02:19 PM. Location: [facility]  -------------------------------------------------------------------  ------------------------------------------------------------------- Left ventricle: The cavity size was normal. Wall thickness was increased in a pattern of mild LVH. Systolic function was vigorous. The estimated ejection fraction was in  the range of 65% to 70%. Wall motion was normal; there were no regional wall motion abnormalities. Doppler parameters are consistent with abnormal left ventricular relaxation (grade 1 diastolic dysfunction).  ------------------------------------------------------------------- Aortic valve: Severely calcified leaflets. Valve mobility was restricted. Doppler: There was critical stenosis. There was trivial regurgitation. VTI ratio of LVOT to aortic valve: 0.24. Valve area (VTI): 0.75 cm^2. Indexed valve area (VTI): 0.35 cm^2/m^2. Peak velocity ratio of LVOT to aortic valve: 0.21. Valve area (Vmax): 0.67 cm^2. Indexed valve area (Vmax): 0.31 cm^2/m^2. Mean velocity ratio of LVOT to aortic valve: 0.18. Valve area (Vmean): 0.55 cm^2. Indexed valve area (Vmean): 0.26 cm^2/m^2. Mean gradient (S): 76 mm Hg. Peak gradient (S): 122 mm Hg.  ------------------------------------------------------------------- Aorta: Aortic root: The aortic root was normal in size.  ------------------------------------------------------------------- Mitral valve: Structurally normal valve. Mobility was not restricted. Doppler: Transvalvular velocity was within the normal range. There was no evidence for stenosis. There was trivial regurgitation. Peak gradient (D): 2 mm Hg.  ------------------------------------------------------------------- Left atrium: The atrium was mildly dilated.  ------------------------------------------------------------------- Right ventricle: The cavity size was normal. Systolic function was normal.  ------------------------------------------------------------------- Pulmonic valve: Doppler: Transvalvular velocity was within the normal range. There was no evidence for stenosis.  ------------------------------------------------------------------- Tricuspid valve: Structurally normal valve. Doppler: Transvalvular velocity was within the normal  range. There was trivial regurgitation.  ------------------------------------------------------------------- Pulmonary artery: Systolic pressure was within the normal range.  ------------------------------------------------------------------- Right atrium: The atrium was normal in size.  ------------------------------------------------------------------- Pericardium: There was no pericardial effusion.  ------------------------------------------------------------------- Measurements  Left ventricle Value Reference LV ID, ED, PLAX chordal (L) 33.2 mm 43 - 52 LV ID, ES, PLAX chordal (L) 20.5 mm 23 - 38 LV fx shortening, PLAX chordal 38 % >=29 LV PW thickness, ED 12.26 mm --------- IVS/LV PW ratio, ED 0.95 <=1.3 Stroke volume, 2D 97 ml --------- Stroke volume/bsa, 2D 45 ml/m^2 --------- LV e&', lateral 6.14 cm/s --------- LV E/e&', lateral 12.46 --------- LV e&', medial 5.81 cm/s --------- LV E/e&', medial 13.17 --------- LV e&', average 5.98 cm/s --------- LV E/e&', average 12.8 ---------  Ventricular septum Value Reference IVS thickness, ED 11.66 mm ---------  LVOT Value Reference LVOT ID, S 20 mm --------- LVOT area 3.14 cm^2 --------- LVOT peak velocity, S 117 cm/s --------- LVOT mean velocity, S 71.1 cm/s  --------- LVOT VTI, S 31 cm --------- LVOT peak gradient, S 5 mm Hg ---------  Aortic valve Value Reference Aortic valve peak velocity, S 552 cm/s --------- Aortic valve mean velocity, S 404 cm/s --------- Aortic valve VTI, S 129 cm --------- Aortic mean gradient, S 76 mm Hg --------- Aortic peak gradient, S 122 mm Hg --------- VTI ratio, LVOT/AV 0.24 --------- Aortic valve area, VTI 0.75 cm^2 --------- Aortic valve area/bsa, VTI 0.35 cm^2/m^2 --------- Velocity ratio, peak, LVOT/AV 0.21 --------- Aortic valve area, peak velocity 0.67 cm^2 --------- Aortic valve area/bsa, peak 0.31 cm^2/m^2 --------- velocity Velocity ratio, mean, LVOT/AV 0.18 --------- Aortic valve area, mean velocity 0.55 cm^2 --------- Aortic valve area/bsa, mean 0.26 cm^2/m^2 --------- velocity Aortic regurg peak velocity 257 cm/s --------- Aortic regurg pressure half-time 816 ms ---------  Aortic regurg peak gradient 26 mm Hg ---------  Aorta Value Reference Aortic root ID, ED 33 mm ---------  Left atrium Value Reference LA ID, A-P, ES 40 mm --------- LA ID/bsa, A-P 1.85 cm/m^2 <=2.2 LA volume, S 81 ml --------- LA volume/bsa, S 37.5 ml/m^2 --------- LA volume, ES, 1-p A4C  77 ml --------- LA volume/bsa, ES, 1-p A4C 35.6 ml/m^2 --------- LA volume, ES, 1-p A2C 77 ml --------- LA volume/bsa, ES, 1-p A2C 35.6 ml/m^2 ---------  Mitral valve Value Reference Mitral E-wave peak velocity 76.5 cm/s --------- Mitral A-wave peak velocity 113 cm/s --------- Mitral deceleration time (H) 296 ms 150 - 230 Mitral peak gradient, D 2 mm Hg --------- Mitral E/A ratio, peak 0.7 ---------  Pulmonary arteries Value Reference PA pressure, S, DP 21 mm Hg <=30  Tricuspid valve Value Reference Tricuspid regurg peak velocity 214 cm/s --------- Tricuspid peak RV-RA gradient 18 mm Hg ---------  Systemic veins Value Reference Estimated CVP 3 mm Hg ---------  Right ventricle Value Reference RV pressure, S, DP 21 mm Hg <=30 RV s&', lateral, S 16.9 cm/s ---------  Legend: (L) and (H) mark values outside specified reference range.  ------------------------------------------------------------------- Prepared and Electronically Authenticated by  Kirk Ruths 2019-04-25T15:44:56    RIGHT HEART CATH AND CORONARY ANGIOGRAPHY  Conclusion     Mid LAD lesion is 25% stenosed. Nonobstructive CAD.  Hemodynamic findings consistent with normal pulmonary artery pressures.  CO 5.3 L/min; CI 2.6; Ao sat 97%, PA sat 71%; PA pressure 26/10, mean 16 mm Hg; mean PCWP 9 mm Hg  Severe aortic stenosis known from  echocardiogram.  Continue with plans for TAVR w/u.  Continue preventive therapy.      Indications   Severe aortic stenosis [I35.0 (ICD-10-CM)]  Procedural Details/Technique   Technical Details The risks, benefits, and details of the procedure were explained to the patient. The patient verbalized understanding and wanted to proceed. Informed written consent was obtained.  PROCEDURE TECHNIQUE: After Xylocaine anesthesia a 53F slender sheath was placed in the right antecubital area in exchange for a peripheral IV. After Xylocaine anesthesia a 53F slender sheath was placed in the right radial artery with a single anterior needle wall stick. IV Heparin was given. Right coronary angiography was done using a Judkins R4 guide catheter. Left coronary angiography was done using a Judkins L3.5 guide catheter. Left ventriculography was not done. A wire did not cross easily. Given that the echo shows severe AS, further attempts to cross were not made. A TR band was used for hemostasis.  Contrast: 65 cc   Estimated blood loss <50 mL.  During this procedure the patient was administered the following to achieve and maintain moderate conscious sedation: Versed 1 mg, Fentanyl 25 mcg, while the patient's heart rate, blood pressure, and oxygen saturation were continuously monitored. The period of conscious sedation was 26 minutes, of which I was present face-to-face 100% of this time.  Complications   Complications documented before study signed (02/05/2018 1:44 PM EDT)    No complications were associated with this study.  Documented by Jettie Booze, MD - 02/05/2018 1:39 PM EDT    Coronary Findings   Diagnostic  Dominance: Right  Left Anterior Descending  Mid LAD lesion 25% stenosed  Mid LAD lesion is 25% stenosed.  Left Circumflex  Vessel is large. The vessel exhibits minimal luminal irregularities.  Right Coronary Artery  The vessel exhibits minimal luminal irregularities.   Intervention   No interventions have been documented.  Right Heart  Right Heart Pressures Hemodynamic findings consistent with pulmonary hypertension. CO 5.3 L/min; CI 2.6; Ao sat 97%, PA sat 71%; PA pressure 26/10, mean 16 mm Hg; mean PCWP 9 mm Hg  Left Heart   Aortic Valve The aortic valve is calcified.  Coronary Diagrams   Diagnostic Diagram       Implants       No implant documentation for this case.  MERGE Images   Show images for CARDIAC CATHETERIZATION   Link to Procedure Log   Procedure Log    Hemo Data    Most Recent Value  Fick Cardiac Output 5.31 L/min  Fick Cardiac Output Index 2.63 (L/min)/BSA  RA A Wave 4 mmHg  RA V Wave 3 mmHg  RA Mean 3 mmHg  RV Systolic Pressure 26 mmHg  RV Diastolic Pressure -1 mmHg  RV EDP 1 mmHg  PA Systolic Pressure 26 mmHg  PA Diastolic Pressure 10 mmHg  PA Mean 16 mmHg  PW A Wave 13 mmHg  PW V Wave 7 mmHg  PW Mean 9 mmHg  AO Systolic Pressure 315 mmHg  AO Diastolic Pressure 62 mmHg  AO Mean 80 mmHg  QP/QS 1  TPVR Index 6.09 HRUI  TSVR Index 30.44 HRUI  PVR SVR Ratio 0.09  TPVR/TSVR Ratio 0.2      EKG:   (01/15/2018) NSR w/ RBBB and LAFB, QRS 124 ms    STS Risk Calculator  Procedure: Isolated AVR CALCULATE   Risk of Mortality:  1.630% Renal Failure:  1.645% Permanent Stroke:  1.229% Prolonged Ventilation:  5.982% DSW Infection:  0.100% Reoperation:  4.007% Morbidity or Mortality:  10.903% Short Length of Stay:  35.024% Long Length of Stay:  4.962%  Impression: 84 YEAR-OLD MAN WITH CRITICAL, STAGE D1 SYMPTOMATIC AORTIC STENOSIS.   Plan: We plan to proceed with transcatheter aortic valve replacement using the transfemoral approach as an alternative to conventional aortic valve replacement.  The patient understands and accepts all potential associated risks of surgery including but not limited to risks of death, stroke, paravalvular leak, aortic dissection, other major  vascular complications, aortic annulus rupture, device embolization, cardiac rupture or perforation, mitral regurgitation, acute myocardial infarction, arrhythmia, heart block or bradycardia requiring permanent pacemaker placement, congestive heart failure, respiratory failure, renal failure, pneumonia, infection, pleural effusion, pericardial effusion or tamponade, pulmonary embolus or other thromboembolic complications, late complications related to structural valve deterioration or migration, or other complications that might ultimately cause a temporary or permanent loss of functional independence or other long term morbidity.  The patient understands he is at higher than normal risk of heart block and need for permanent pacing considering his underlying conduction system disease with bifascicular block including right bundle branch block.  Following the decision to proceed with transcatheter aortic valve replacement, a discussion has also been held regarding what types of management strategies would be attempted intraoperatively in the event of life-threatening complications, including whether or not the patient would be considered a candidate for the use of cardiopulmonary bypass and/or conversion to open sternotomy for attempted surgical intervention.  The patient provides full informed consent for the procedure as planned and all questions have been answered.   Blane Ohara, M.D. 03/09/2018 10:17 AM

## 2018-03-10 ENCOUNTER — Other Ambulatory Visit: Payer: Self-pay | Admitting: Physician Assistant

## 2018-03-10 ENCOUNTER — Inpatient Hospital Stay (HOSPITAL_COMMUNITY): Payer: Medicare HMO

## 2018-03-10 ENCOUNTER — Encounter (HOSPITAL_COMMUNITY): Payer: Self-pay | Admitting: Cardiovascular Disease

## 2018-03-10 DIAGNOSIS — Z952 Presence of prosthetic heart valve: Secondary | ICD-10-CM

## 2018-03-10 DIAGNOSIS — I5030 Unspecified diastolic (congestive) heart failure: Secondary | ICD-10-CM

## 2018-03-10 DIAGNOSIS — I350 Nonrheumatic aortic (valve) stenosis: Principal | ICD-10-CM

## 2018-03-10 LAB — BASIC METABOLIC PANEL
Anion gap: 4 — ABNORMAL LOW (ref 5–15)
BUN: 17 mg/dL (ref 8–23)
CALCIUM: 8.1 mg/dL — AB (ref 8.9–10.3)
CO2: 22 mmol/L (ref 22–32)
Chloride: 110 mmol/L (ref 98–111)
Creatinine, Ser: 1.04 mg/dL (ref 0.61–1.24)
GFR calc Af Amer: >60 mL/min (ref >60)
GLUCOSE: 104 mg/dL — AB (ref 70–99)
POTASSIUM: 4 mmol/L (ref 3.5–5.1)
SODIUM: 136 mmol/L (ref 135–145)

## 2018-03-10 LAB — CBC
HCT: 39.4 % (ref 39.0–52.0)
Hemoglobin: 13.1 g/dL (ref 13.0–17.0)
MCH: 30 pg (ref 26.0–34.0)
MCHC: 33.2 g/dL (ref 30.0–36.0)
MCV: 90.4 fL (ref 80.0–100.0)
NRBC: 0 % (ref 0.0–0.2)
PLATELETS: 148 10*3/uL — AB (ref 150–400)
RBC: 4.36 MIL/uL (ref 4.22–5.81)
RDW: 13.2 % (ref 11.5–15.5)
WBC: 13.7 10*3/uL — ABNORMAL HIGH (ref 4.0–10.5)

## 2018-03-10 LAB — ECHOCARDIOGRAM COMPLETE
HEIGHTINCHES: 69.5 in
WEIGHTICAEL: 3298.08 [oz_av]

## 2018-03-10 LAB — MAGNESIUM: MAGNESIUM: 1.8 mg/dL (ref 1.7–2.4)

## 2018-03-10 MED FILL — Magnesium Sulfate Inj 50%: INTRAMUSCULAR | Qty: 10 | Status: AC

## 2018-03-10 MED FILL — Heparin Sodium (Porcine) Inj 1000 Unit/ML: INTRAMUSCULAR | Qty: 30 | Status: AC

## 2018-03-10 MED FILL — Phenylephrine HCl IV Soln 10 MG/ML: INTRAVENOUS | Qty: 2 | Status: AC

## 2018-03-10 MED FILL — Sodium Chloride IV Soln 0.9%: INTRAVENOUS | Qty: 250 | Status: AC

## 2018-03-10 MED FILL — Potassium Chloride Inj 2 mEq/ML: INTRAVENOUS | Qty: 40 | Status: AC

## 2018-03-10 NOTE — Progress Notes (Signed)
Arterial Sheath pulled at 0945 manual pressure held for 20 minutes. Right groin site is a Level 0 post intervention.Patient's vital signs remained stable throughout procedure. Post removal education was provided and patient verbalizes understanding. Will continue to monitor.   Scheryl Darter RN

## 2018-03-10 NOTE — Progress Notes (Addendum)
[facility] VALVE TEAM  Patient Name: Robert Tapia Date of Encounter: 03/10/2018  Primary Cardiologist: [name] / Dr. Burt Knack & Dr. Roxy Manns (TAVR)  Hospital Problem List     Principal Problem:   S/P TAVR (transcatheter aortic valve replacement) Active Problems:   Severe aortic stenosis   Essential hypertension, benign   Pure hypercholesterolemia   Left ventricular hypertrophy   Overweight   Bifascicular bundle branch block   CKD (chronic kidney disease) stage 3, GFR 30-59 ml/min (HCC)     Subjective   Feeling good. No complaints. Eager to see how he feels walking around after bedrest complete.    Inpatient Medications    Scheduled Meds: . aspirin  81 mg Oral Daily  . clopidogrel  75 mg Oral Q breakfast  . mouth rinse  15 mL Mouth Rinse BID  . sodium chloride flush  3 mL Intravenous Q12H   Continuous Infusions: . sodium chloride    . cefUROXime (ZINACEF)  IV 200 mL/hr at 03/10/18 0800  . nitroGLYCERIN    . phenylephrine (NEO-SYNEPHRINE) Adult infusion Stopped (03/10/18 0747)   PRN Meds: sodium chloride, acetaminophen **OR** acetaminophen, morphine injection, ondansetron (ZOFRAN) IV, oxyCODONE, sodium chloride flush, traMADol   Vital Signs    Vitals:   03/10/18 0500 03/10/18 0600 03/10/18 0700 03/10/18 0800  BP:  (!) 104/54    Pulse: (!) 56 (!) 53 65   Resp: 12 13 16    Temp:    98.2 F (36.8 C)  TempSrc:      SpO2: 95% 94% 95%   Weight: 93.5 kg     Height:        Intake/Output Summary (Last 24 hours) at 03/10/2018 0919 Last data filed at 03/10/2018 0800 Gross per 24 hour  Intake 2107.38 ml  Output 1465 ml  Net 642.38 ml   Filed Weights   03/09/18 1042 03/10/18 0500  Weight: 88 kg 93.5 kg    Physical Exam   GEN: Well nourished, well developed, in no acute distress.  HEENT: Grossly normal.  Neck: Supple, no JVD, carotid bruits, or masses. Cardiac: RRR, very soft flow murmur. No rubs, or gallops. No  clubbing, cyanosis, edema.  Radials/DP/PT 2+ and equal bilaterally.  Respiratory:  Respirations regular and unlabored, clear to auscultation bilaterally. GI: Soft, nontender, nondistended, BS + x 4. MS: no deformity or atrophy. Skin: warm and dry, no rash. Groin sites with no ecchymosis or hematoma Neuro:  Strength and sensation are intact. Psych: AAOx3.  Normal affect.  Labs    CBC Recent Labs    03/09/18 1554 03/10/18 0232  WBC  --  13.7*  HGB 13.3 13.1  HCT 39.0 39.4  MCV  --  90.4  PLT  --  161*   Basic Metabolic Panel Recent Labs    03/09/18 1520 03/09/18 1554 03/10/18 0232  NA 140 141 136  K 4.4 4.3 4.0  CL 107  --  110  CO2  --   --  22  GLUCOSE 141* 137* 104*  BUN 22  --  17  CREATININE 1.30*  --  1.04  CALCIUM  --   --  8.1*  MG  --   --  1.8   Liver Function Tests No results for input(s): AST, ALT, ALKPHOS, BILITOT, PROT, ALBUMIN in the last 72 hours. No results for input(s): LIPASE, AMYLASE in the last 72 hours. Cardiac Enzymes No results for input(s): CKTOTAL, CKMB, CKMBINDEX, TROPONINI in the last 72 hours.  BNP Invalid input(s): POCBNP D-Dimer No results for input(s): DDIMER in the last 72 hours. Hemoglobin A1C No results for input(s): HGBA1C in the last 72 hours. Fasting Lipid Panel No results for input(s): CHOL, HDL, LDLCALC, TRIG, CHOLHDL, LDLDIRECT in the last 72 hours. Thyroid Function Tests No results for input(s): TSH, T4TOTAL, T3FREE, THYROIDAB in the last 72 hours.  Invalid input(s): FREET3  Telemetry    Sinus with few short runs of NSVT, PACs - Personally Reviewed  ECG    Not performed - Personally Reviewed  Radiology    Dg Chest Port 1 View  Result Date: 03/09/2018 CLINICAL DATA:  Status post TAVR EXAM: PORTABLE CHEST 1 VIEW COMPARISON:  03/05/2018, CT 03/02/2018 FINDINGS: Placement of right-sided central venous catheter with tip over the SVC. Interval aortic valve replacement. Low lung volumes. Borderline cardiomegaly with  aortic atherosclerosis. No pneumothorax. Streaky bibasilar atelectasis. IMPRESSION: 1. Interval aortic valve replacement and placement of right IJ central venous catheter with tip over the SVC. No pneumothorax 2. Streaky bibasilar atelectasis. Electronically Signed   By: Donavan Foil M.D.   On: 03/09/2018 16:56    Cardiac Studies  TAVR OPERATIVE NOTE   Date of Procedure:                03/09/2018  Preoperative Diagnosis:      Severe Aortic Stenosis   Postoperative Diagnosis:    Same   Procedure:        Transcatheter Aortic Valve Replacement - Percutaneous Left Transfemoral Approach             Edwards Sapien 3 THV (size 26 mm, model # 9600TFX, serial # [id number])              Co-Surgeons:                        Valentina Gu. Roxy Manns, MD and Sherren Mocha, MD  Pre-operative Echo Findings: ? Severe aortic stenosis ? Normal left ventricular systolic function  Post-operative Echo Findings: ? Moderate paravalvular leak ? Unchanged left ventricular systolic function  ______________  Post op echocardiogram 03/10/18: pending  Patient Profile     Robert Tapia is a 84 y.o. male with a history of RBBB with LAFB, HLD, CKD stage III, HTN and severe AS who presented to [facility] on [facility]/09/18 for planned TAVR.   Assessment & Plan    Severe AS: s/p successful TAVR with a 26 mm Edwards Sapien 3 THV via the TF approach on 03/09/18. Post operative echo pending. Groin sites are stable. ECG not completed yet but tele shows sinus with no high grade heart block. Temp wire removed. Continue ASA and plavix. Will plan to transfer to 4E and hopeful discharge over the next 24 hours.   HTN: BP well controlled, on soft side but stable. Will continue to hold home antihypertensives  CKD stage III: creat in normal range today   RBBB w/ LAFB: he has not had any conduction issues overnight. Temp wire removed. He is not on any AV nodal blocking agents. Will continue to monitor closely.  SignedAngelena Form, PA-C  03/10/2018, 9:19 AM  Pager [phone]  Patient seen, examined. Available data reviewed. Agree with findings, assessment, and plan as outlined by Nell Range, PA-C.  On exam the patient is alert and oriented, in no distress.  Lung fields are clear, JVP is normal, heart is regular rate and rhythm with a grade 2/6 systolic ejection murmur at the right upper sternal border,  no diastolic murmur, abdomen is soft and nontender, bilateral groin sites are clear with a right femoral venous sheath still in place, there is no pretibial edema.  Telemetry is reviewed and demonstrates sinus rhythm with stable right bundle branch pattern.  I agree the patient's temporary pacing wire should be discontinued today.  We will  remove his venous sheath and transfer him to a telemetry bed.  Anticipate discharge tomorrow.  Await postoperative day number one echocardiogram study.  Otherwise as outlined above.  Sherren Mocha, M.D. 03/10/2018 10:42 AM

## 2018-03-10 NOTE — Progress Notes (Signed)
VAST RN to pt's bedside to evaluate removal of CL as ordered. Unit RN stated she already removed CL and A-line.

## 2018-03-10 NOTE — CV Procedure (Signed)
Attempted 2D Echo, RN in room with patient, will try again at a later time.   03/10/18 Cardell Peach

## 2018-03-10 NOTE — Discharge Instructions (Signed)

## 2018-03-10 NOTE — Plan of Care (Signed)
  Problem: Education: Goal: Knowledge of General Education information will improve Description Including pain rating scale, medication(s)/side effects and non-pharmacologic comfort measures Outcome: Progressing   Problem: Clinical Measurements: Goal: Ability to maintain clinical measurements within normal limits will improve Outcome: Progressing Goal: Cardiovascular complication will be avoided Outcome: Progressing  Pt is in SB at rest with BP maintaining with continuous Phenylephrine drip; pacing wires attached to box, but not turned on Problem: Nutrition: Goal: Adequate nutrition will be maintained Outcome: Progressing   Problem: Pain Managment: Goal: General experience of comfort will improve Outcome: Progressing   Problem: Safety: Goal: Ability to remain free from injury will improve Outcome: Progressing

## 2018-03-10 NOTE — Progress Notes (Signed)
CARDIAC REHAB PHASE I   PRE:  Rate/Rhythm: 81 SR  BP:  Supine: 108/52  Sitting:   Standing:    SaO2: 94%RA  MODE:  Ambulation: 370 ft   POST:  Rate/Rhythm: 106 ST  BP:  Supine:   Sitting: 112/61  Standing:    SaO2: 95%RA 1430-1455 Pt walked 370 ft on RA with asst x 1. Gait steady and he tolerated well. To recliner with call bell. Wife in room.   Graylon Good, RN BSN  03/10/2018 2:53 PM

## 2018-03-11 ENCOUNTER — Encounter (HOSPITAL_COMMUNITY): Payer: Self-pay | Admitting: Cardiovascular Disease

## 2018-03-11 LAB — CBC
HEMATOCRIT: 39.7 % (ref 39.0–52.0)
HEMOGLOBIN: 13.3 g/dL (ref 13.0–17.0)
MCH: 30.1 pg (ref 26.0–34.0)
MCHC: 33.5 g/dL (ref 30.0–36.0)
MCV: 89.8 fL (ref 80.0–100.0)
Platelets: 116 10*3/uL — ABNORMAL LOW (ref 150–400)
RBC: 4.42 MIL/uL (ref 4.22–5.81)
RDW: 13.2 % (ref 11.5–15.5)
WBC: 11.2 10*3/uL — ABNORMAL HIGH (ref 4.0–10.5)
nRBC: 0 % (ref 0.0–0.2)

## 2018-03-11 LAB — BASIC METABOLIC PANEL
Anion gap: 4 — ABNORMAL LOW (ref 5–15)
BUN: 16 mg/dL (ref 8–23)
CHLORIDE: 108 mmol/L (ref 98–111)
CO2: 24 mmol/L (ref 22–32)
Calcium: 8.6 mg/dL — ABNORMAL LOW (ref 8.9–10.3)
Creatinine, Ser: 1.18 mg/dL (ref 0.61–1.24)
GFR calc Af Amer: >60 mL/min (ref >60)
GFR calc non Af Amer: 55 mL/min — ABNORMAL LOW (ref >60)
GLUCOSE: 103 mg/dL — AB (ref 70–99)
Potassium: 3.7 mmol/L (ref 3.5–5.1)
Sodium: 136 mmol/L (ref 135–145)

## 2018-03-11 MED ORDER — MUPIROCIN 2 % EX OINT
1.0000 "application " | TOPICAL_OINTMENT | Freq: Two times a day (BID) | CUTANEOUS | Status: DC
  Administered 2018-03-11: 1 via NASAL
  Filled 2018-03-11: qty 22

## 2018-03-11 MED ORDER — LISINOPRIL 5 MG PO TABS: 5.0000 mg | ORAL_TABLET | Freq: Every day | ORAL | 6 refills | Status: DC

## 2018-03-11 MED ORDER — POTASSIUM CHLORIDE CRYS ER 20 MEQ PO TBCR
20.0000 meq | EXTENDED_RELEASE_TABLET | ORAL | Status: AC
  Administered 2018-03-11 (×3): 20 meq via ORAL
  Filled 2018-03-11 (×3): qty 1

## 2018-03-11 MED ORDER — CHLORHEXIDINE GLUCONATE CLOTH 2 % EX PADS: 6.0000 | MEDICATED_PAD | Freq: Every day | CUTANEOUS | Status: DC

## 2018-03-11 MED ORDER — CLOPIDOGREL BISULFATE 75 MG PO TABS: 75.0000 mg | ORAL_TABLET | Freq: Every day | ORAL | 1 refills | Status: DC

## 2018-03-11 MED ORDER — ASPIRIN 81 MG PO CHEW: 81.0000 mg | CHEWABLE_TABLET | Freq: Every day | ORAL | Status: AC

## 2018-03-11 NOTE — Progress Notes (Signed)
Discharge instructions and education given to pt and wife. Pt has no questions and is ready to be wheeled off unit. All Ivs removed.

## 2018-03-11 NOTE — Consult Note (Signed)
            [facility] CM Primary Care Navigator  03/11/2018  Robert Tapia [birth year] [id number]   Attempt to seepatient at the bedside to identify possible discharge needs buthewasalreadydischargedhomeper staff.  Per MD note,patient was followed for aortic stenosis for many years, with gradual progression of such. He was admitted for severe, symptomatic aortic stenosis and underwent TAVR- transcatheter aortic valve replacement.  Patient has discharge instruction to follow-up withcardiology on 03/17/18.  Primary care provider's office is listed as providing transition of care (TOC) follow-up.   For additional questions please contact:  Edwena Felty A. Tamara Kenyon, BSN, RN-BC [facility] PRIMARY CARE Navigator Cell: [phone]

## 2018-03-11 NOTE — Plan of Care (Signed)
?  Problem: Activity: ?Goal: Risk for activity intolerance will decrease ?Outcome: Progressing ?  ?Problem: Nutrition: ?Goal: Adequate nutrition will be maintained ?Outcome: Progressing ?  ?Problem: Elimination: ?Goal: Will not experience complications related to urinary retention ?Outcome: Progressing ?  ?Problem: Pain Managment: ?Goal: General experience of comfort will improve ?Outcome: Progressing ?  ?Problem: Safety: ?Goal: Ability to remain free from injury will improve ?Outcome: Progressing ?  ?Problem: Skin Integrity: ?Goal: Risk for impaired skin integrity will decrease ?Outcome: Progressing ?  ?

## 2018-03-11 NOTE — Progress Notes (Signed)
CARDIAC REHAB PHASE I   PRE:  Rate/Rhythm: 81 SR  BP:  Supine:   Sitting: 112/72  Standing:    SaO2: 94%RA  MODE:  Ambulation: 740 ft   POST:  Rate/Rhythm: 103 ST  BP:  Supine:   Sitting: 148/79  Standing:    SaO2: 93%RA 0952-1018 Pt walked 740 ft on RA with fairly steady gait. Tolerated well. Gave ex ed and heart healthy diet. Discussed CRP 2 and referred to [place].   Graylon Good, RN BSN  03/11/2018 10:14 AM

## 2018-03-11 NOTE — Discharge Summary (Addendum)
[place] VALVE TEAM  Discharge Summary    Patient ID: Robert Tapia MRN: [id number]; DOB: 1931-10-15  Admit date: 03/09/2018 Discharge date: 03/11/2018  Primary Care Provider: Aretta Nip, MD  Primary Cardiologist: [name] / Dr. Burt Knack & Dr. Roxy Manns (TAVR)  Discharge Diagnoses    Principal Problem:   S/P TAVR (transcatheter aortic valve replacement) Active Problems:   Severe aortic stenosis   Essential hypertension, benign   Pure hypercholesterolemia   Left ventricular hypertrophy   Overweight   Bifascicular bundle branch block   CKD (chronic kidney disease) stage 3, GFR 30-59 ml/min (HCC)   Allergies No Known Allergies  Diagnostic Studies/Procedures    TAVR OPERATIVE NOTE   Date of Procedure:03/09/2018  Preoperative Diagnosis:Severe Aortic Stenosis   Postoperative Diagnosis:Same   Procedure:   Transcatheter Aortic Valve Replacement - PercutaneousLeftTransfemoral Approach Edwards Sapien 3 THV (size 76mm, model # 9600TFX, serial # [id number])  Co-Surgeons:Clarence H. Roxy Manns, MD and Sherren Mocha, MD  Pre-operative Echo Findings: ? Severe aortic stenosis ? Normalleft ventricular systolic function  Post-operative Echo Findings: ? Moderateparavalvular leak ? Unchangedleft ventricular systolic function  ______________  Post op echocardiogram 03/10/18  Study Conclusions - Left ventricle: The cavity size was normal. Wall thickness was   increased in a pattern of moderate LVH. Systolic function was   normal. The estimated ejection fraction was in the range of 60%   to 65%. Doppler parameters are consistent with abnormal left   ventricular relaxation (grade 1 diastolic dysfunction). - Aortic valve: A bioprosthesis was present. There was mild to   moderate regurgitation. Valve area (VTI): 1.91 cm^2. Valve area   (Vmax): 1.88  cm^2. Valve area (Vmean): 1.89 cm^2. - Left atrium: The atrium was mildly dilated. - Right atrium: The atrium was mildly dilated.   History of Present Illness     Robert Tapia is a 84 y.o. male with a history of RBBB with LAFB, HLD, CKD stage III, HTN and severe AS who presented to [facility] on 03/09/18 for planned TAVR.  The patient has been followed for aortic stenosis for many years.  His serial echo studies have demonstrated gradual progression of aortic stenosis from the severe range back in 2015 now to a critical range where his peak systolic velocity is greater than 5.5 m/s and his mean transvalvular gradient is 76 mmHg.  His LV function remains normal with an ejection fraction of 65 to 70%.  After extensive discussion with both Dr. Illene Bolus and Dr. Caryl Comes, he agreed to seek further work-up for consideration of TAVR.  He has now undergone right and left heart catheterization demonstrating no significant coronary artery disease and normal right heart pressures.   The patient has been evaluated by the multidisciplinary valve team and felt to have severe, symptomatic aortic stenosis and to be a suitable candidate for TAVR, which was set up for 03/09/18.    Hospital Course     Consultants: none  Severe AS:s/p successful TAVR with a 26 mm Edwards Sapien 3 THV via the TF approach on 03/09/18. Post operative echo showed EF 60%, normally functioning TAVR with mild-mod PVL and mean gradient of 16 mmHg. Groin sites are stable. ECG shows stable old RBBB with LAFB. Temp wire removed after overnight observation. Continue ASA and plavix. Plan for discharge home today with follow up in the office next week .   HTN: BP has been well controlled. Will plan to resume home Lisinopril 5mg  daily but discontinued amlodipine 10mg   daily. This can be added back in the outpatient setting if BP becomes elevated  CKD stage III: creat 1.18 at discharge  RBBB w/ LAFB: he has not had any conduction issues during  admission. Temp wire removed. He is not on any AV nodal blocking agents. Plan for ECG at 1 week follow up.  _____________  Discharge Vitals Blood pressure 114/75, pulse 89, temperature 97.6 F (36.4 C), temperature source Oral, resp. rate 14, height 5' 9.5" (1.765 m), weight 86.7 kg, SpO2 96 %.  Filed Weights   03/09/18 1042 03/10/18 0500 03/11/18 0500  Weight: 88 kg 93.5 kg 86.7 kg    PHYSICAL EXAM:    GEN: Well nourished, well developed, in no acute distress HEENT: normal Neck: no JVD or masses Cardiac: RRR; very soft flow murmur. No rubs, or gallops,no edema  Respiratory:  clear to auscultation bilaterally, normal work of breathing GI: soft, nontender, nondistended, + BS MS: no deformity or atrophy Skin: warm and dry, no rash. Groin sites clear Neuro:  Alert and Oriented x 3, Strength and sensation are intact Psych: euthymic mood, full affect    Labs & Radiologic Studies    CBC Recent Labs    03/10/18 0232 03/11/18 0256  WBC 13.7* 11.2*  HGB 13.1 13.3  HCT 39.4 39.7  MCV 90.4 89.8  PLT 148* 387*   Basic Metabolic Panel Recent Labs    03/10/18 0232 03/11/18 0256  NA 136 136  K 4.0 3.7  CL 110 108  CO2 22 24  GLUCOSE 104* 103*  BUN 17 16  CREATININE 1.04 1.18  CALCIUM 8.1* 8.6*  MG 1.8  --    Liver Function Tests No results for input(s): AST, ALT, ALKPHOS, BILITOT, PROT, ALBUMIN in the last 72 hours. No results for input(s): LIPASE, AMYLASE in the last 72 hours. Cardiac Enzymes No results for input(s): CKTOTAL, CKMB, CKMBINDEX, TROPONINI in the last 72 hours. BNP Invalid input(s): POCBNP D-Dimer No results for input(s): DDIMER in the last 72 hours. Hemoglobin A1C No results for input(s): HGBA1C in the last 72 hours. Fasting Lipid Panel No results for input(s): CHOL, HDL, LDLCALC, TRIG, CHOLHDL, LDLDIRECT in the last 72 hours. Thyroid Function Tests No results for input(s): TSH, T4TOTAL, T3FREE, THYROIDAB in the last 72 hours.  Invalid input(s):  FREET3 _____________  Dg Chest 2 View  Result Date: 03/05/2018 CLINICAL DATA:  Preop TAVR. EXAM: CHEST - 2 VIEW COMPARISON:  CT chest 03/02/2018. FINDINGS: Trachea is midline. Heart size is accentuated by low lung volumes. Thoracic aorta is calcified. Suspect mild peripheral interstitial coarsening. No airspace consolidation or pleural fluid. IMPRESSION: 1. No acute findings. 2. Possible mild peripheral interstitial coarsening which can be seen with interstitial lung disease. Please correlate clinically. Electronically Signed   By: Lorin Picket M.D.   On: 03/05/2018 12:07   Ct Coronary Morph W/cta Cor W/score W/ca W/cm &/or Wo/cm  Addendum Date: 03/02/2018   ADDENDUM REPORT: 03/02/2018 15:26 EXAM: OVER-READ INTERPRETATION  CT CHEST The following report is an over-read performed by radiologist Dr. Rebekah Chesterfield [facility] on 03/02/2018. This over-read does not include interpretation of cardiac or coronary anatomy or pathology. The cardiac CTA interpretation by the cardiologist is attached. COMPARISON:  Chest CT 12/16/2013. FINDINGS: Extracardiac findings will be described separately under dictation for contemporaneously obtained CTA chest, abdomen and pelvis. IMPRESSION: Please see separate dictation for contemporaneously obtained CTA chest, abdomen and pelvis dated 03/02/2018 for full description of relevant extracardiac findings. Electronically Signed   By: Quillian Quince  Entrikin M.D.   On: 03/02/2018 15:26   Result Date: 03/02/2018 CLINICAL DATA:  84 year old male with severe aortic stenosis being evaluated for a TAVR procedure. EXAM: Cardiac TAVR CT TECHNIQUE: The patient was scanned on a [employer]. A 120 kV retrospective scan was triggered in the descending thoracic aorta at 111 HU's. Gantry rotation speed was 250 msecs and collimation was .6 mm. No beta blockade or nitro were given. The 3D data set was reconstructed in 5% intervals of the R-R cycle. Systolic and  diastolic phases were analyzed on a dedicated work station using MPR, MIP and VRT modes. The patient received 80 cc of contrast. FINDINGS: Aortic Valve: Trileaflet aortic valve with severe thickening and calcifications with only mild calcifications extending into the LVOT. Aorta: Normal size, with mild diffuse calcifications and atheroma and no dissection. Sinotubular Junction: 29 x 27 mm Ascending Thoracic Aorta: 35 x 34 mm Aortic Arch: 30 x 28 mm Descending Thoracic Aorta: 28 x 27 mm Sinus of Valsalva Measurements: Non-coronary: 35 mm Right -coronary: 35 mm Left -coronary: 35 mm Coronary Artery Height above Annulus: Left Main: 15 mm Right Coronary: 16.5 mm Virtual Basal Annulus Measurements: Maximum/Minimum Diameter: 28.9 x 24.6 mm Mean Diameter: 25.8 mm Perimeter: 82.4 mm Area: 522 mm2 Optimum Fluoroscopic Angle for Delivery: LAO 2 CAU 1 IMPRESSION: 1. Trileaflet aortic valve with severe thickening and calcifications with only mild calcifications extending into the LVOT. Annular measurements suitable for delivery of a 26 mm Edwards-SAPIEN 3 valve by area measurement but rather 29 mm valve by diameter measurements. 2. Sufficient coronary to annulus distance. 3. Optimum Fluoroscopic Angle for Delivery:  LAO 2 CAU 1 4. No thrombus in the left atrial appendage. Electronically Signed: By: Ena Dawley On: 03/02/2018 15:20   Dg Chest Port 1 View  Result Date: 03/09/2018 CLINICAL DATA:  Status post TAVR EXAM: PORTABLE CHEST 1 VIEW COMPARISON:  03/05/2018, CT 03/02/2018 FINDINGS: Placement of right-sided central venous catheter with tip over the SVC. Interval aortic valve replacement. Low lung volumes. Borderline cardiomegaly with aortic atherosclerosis. No pneumothorax. Streaky bibasilar atelectasis. IMPRESSION: 1. Interval aortic valve replacement and placement of right IJ central venous catheter with tip over the SVC. No pneumothorax 2. Streaky bibasilar atelectasis. Electronically Signed   By: Donavan Foil  M.D.   On: 03/09/2018 16:56   Ct Angio Chest Aorta W &/or Wo Contrast  Result Date: 03/02/2018 CLINICAL DATA:  84 year old male with history of severe symptomatic aortic stenosis. Preprocedural study prior to potential transcatheter aortic valve replacement (TAVR) procedure. EXAM: CT ANGIOGRAPHY CHEST, ABDOMEN AND PELVIS TECHNIQUE: Multidetector CT imaging through the chest, abdomen and pelvis was performed using the standard protocol during bolus administration of intravenous contrast. Multiplanar reconstructed images and MIPs were obtained and reviewed to evaluate the vascular anatomy. CONTRAST:  120mL ISOVUE-370 IOPAMIDOL (ISOVUE-370) INJECTION 76% COMPARISON:  Chest CT 12/16/2013. FINDINGS: CTA CHEST FINDINGS Cardiovascular: Heart size is borderline enlarged. There is no significant pericardial fluid, thickening or pericardial calcification. There is aortic atherosclerosis, as well as atherosclerosis of the great vessels of the mediastinum and the coronary arteries, including calcified atherosclerotic plaque in the left main, left anterior descending, left circumflex and right coronary arteries. Severe thickening and calcification of the aortic valve. Calcifications of the mitral-aortic intervalvular fibrosa. Mediastinum/Lymph Nodes: No pathologically enlarged mediastinal or hilar lymph nodes. Esophagus is unremarkable in appearance. No axillary lymphadenopathy. Lungs/Pleura: No acute consolidative airspace disease. No pleural effusions. Mild scarring in the lung bases bilaterally. No suspicious appearing pulmonary nodules or  masses are noted. Musculoskeletal/Soft Tissues: There are no aggressive appearing lytic or blastic lesions noted in the visualized portions of the skeleton. CTA ABDOMEN AND PELVIS FINDINGS Hepatobiliary: No suspicious cystic or solid hepatic lesions. No intra or extrahepatic biliary ductal dilatation. Gallbladder is unremarkable in appearance. Pancreas: No pancreatic mass. No  pancreatic ductal dilatation. No pancreatic or peripancreatic fluid or inflammatory changes. Spleen: Unremarkable. Adrenals/Urinary Tract: Subcentimeter low-attenuation lesions in both kidneys, too small to characterize, but statistically likely to represent cysts. Other low-attenuation lesions in the kidneys bilaterally are compatible with simple cysts, largest of which measures 2.7 cm in the upper pole the left kidney. No suspicious renal lesions. Bilateral adrenal glands are normal in appearance. No hydroureteronephrosis. Urinary bladder is unremarkable in appearance. Stomach/Bowel: Normal appearance of the stomach. No pathologic dilatation of small bowel or colon. Normal appendix. Vascular/Lymphatic: Aortic atherosclerosis, without evidence of aneurysm or dissection in the abdominal or pelvic vasculature. Vascular findings and measurements pertinent to potential TAVR procedure, as detailed below. No lymphadenopathy noted in the abdomen or pelvis. Reproductive: Prostate gland and seminal vesicles are unremarkable in appearance. Other: Left inguinal hernia containing only fat. Small umbilical hernia containing only omental fat. No significant volume of ascites. No pneumoperitoneum. Musculoskeletal: There are no aggressive appearing lytic or blastic lesions noted in the visualized portions of the skeleton. VASCULAR MEASUREMENTS PERTINENT TO TAVR: AORTA: Minimal Aortic Diameter-17 x 17 mm Severity of Aortic Calcification-mild RIGHT PELVIS: Right Common Iliac Artery - Minimal Diameter-11.0 x 10.9 mm Tortuosity-mild Calcification-mild Right External Iliac Artery - Minimal Diameter-12.2 x 12.0 mm Tortuosity-moderate Calcification-mild Right Common Femoral Artery - Minimal Diameter-10.8 x 10.7 mm Tortuosity-mild Calcification-mild LEFT PELVIS: Left Common Iliac Artery - Minimal Diameter-11.7 x 9.5 mm Tortuosity-mild-to-moderate Calcification-mild Left External Iliac Artery - Minimal Diameter-9.7 x 10.2 mm  Tortuosity-moderate to severe Calcification-none Left Common Femoral Artery - Minimal Diameter-10.5 x 11.4 mm Tortuosity-mild Calcification-mild Review of the MIP images confirms the above findings. IMPRESSION: 1. Vascular findings and measurements pertinent to potential TAVR procedure, as detailed above. 2. Severe thickening calcifications of the aortic valve, compatible with the reported clinical history of severe aortic stenosis. 3. Aortic atherosclerosis, in addition to left main and 3 vessel coronary artery disease. 4. Small umbilical and left inguinal hernias containing only fat. No associated bowel incarceration or obstruction at this time. 5. Additional incidental findings, as above. Electronically Signed   By: Vinnie Langton M.D.   On: 03/02/2018 15:52   Ct Angio Abd/pel W/ And/or W/o  Result Date: 03/02/2018 CLINICAL DATA:  84 year old male with history of severe symptomatic aortic stenosis. Preprocedural study prior to potential transcatheter aortic valve replacement (TAVR) procedure. EXAM: CT ANGIOGRAPHY CHEST, ABDOMEN AND PELVIS TECHNIQUE: Multidetector CT imaging through the chest, abdomen and pelvis was performed using the standard protocol during bolus administration of intravenous contrast. Multiplanar reconstructed images and MIPs were obtained and reviewed to evaluate the vascular anatomy. CONTRAST:  157mL ISOVUE-370 IOPAMIDOL (ISOVUE-370) INJECTION 76% COMPARISON:  Chest CT 12/16/2013. FINDINGS: CTA CHEST FINDINGS Cardiovascular: Heart size is borderline enlarged. There is no significant pericardial fluid, thickening or pericardial calcification. There is aortic atherosclerosis, as well as atherosclerosis of the great vessels of the mediastinum and the coronary arteries, including calcified atherosclerotic plaque in the left main, left anterior descending, left circumflex and right coronary arteries. Severe thickening and calcification of the aortic valve. Calcifications of the  mitral-aortic intervalvular fibrosa. Mediastinum/Lymph Nodes: No pathologically enlarged mediastinal or hilar lymph nodes. Esophagus is unremarkable in appearance. No axillary lymphadenopathy. Lungs/Pleura: No acute  consolidative airspace disease. No pleural effusions. Mild scarring in the lung bases bilaterally. No suspicious appearing pulmonary nodules or masses are noted. Musculoskeletal/Soft Tissues: There are no aggressive appearing lytic or blastic lesions noted in the visualized portions of the skeleton. CTA ABDOMEN AND PELVIS FINDINGS Hepatobiliary: No suspicious cystic or solid hepatic lesions. No intra or extrahepatic biliary ductal dilatation. Gallbladder is unremarkable in appearance. Pancreas: No pancreatic mass. No pancreatic ductal dilatation. No pancreatic or peripancreatic fluid or inflammatory changes. Spleen: Unremarkable. Adrenals/Urinary Tract: Subcentimeter low-attenuation lesions in both kidneys, too small to characterize, but statistically likely to represent cysts. Other low-attenuation lesions in the kidneys bilaterally are compatible with simple cysts, largest of which measures 2.7 cm in the upper pole the left kidney. No suspicious renal lesions. Bilateral adrenal glands are normal in appearance. No hydroureteronephrosis. Urinary bladder is unremarkable in appearance. Stomach/Bowel: Normal appearance of the stomach. No pathologic dilatation of small bowel or colon. Normal appendix. Vascular/Lymphatic: Aortic atherosclerosis, without evidence of aneurysm or dissection in the abdominal or pelvic vasculature. Vascular findings and measurements pertinent to potential TAVR procedure, as detailed below. No lymphadenopathy noted in the abdomen or pelvis. Reproductive: Prostate gland and seminal vesicles are unremarkable in appearance. Other: Left inguinal hernia containing only fat. Small umbilical hernia containing only omental fat. No significant volume of ascites. No pneumoperitoneum.  Musculoskeletal: There are no aggressive appearing lytic or blastic lesions noted in the visualized portions of the skeleton. VASCULAR MEASUREMENTS PERTINENT TO TAVR: AORTA: Minimal Aortic Diameter-17 x 17 mm Severity of Aortic Calcification-mild RIGHT PELVIS: Right Common Iliac Artery - Minimal Diameter-11.0 x 10.9 mm Tortuosity-mild Calcification-mild Right External Iliac Artery - Minimal Diameter-12.2 x 12.0 mm Tortuosity-moderate Calcification-mild Right Common Femoral Artery - Minimal Diameter-10.8 x 10.7 mm Tortuosity-mild Calcification-mild LEFT PELVIS: Left Common Iliac Artery - Minimal Diameter-11.7 x 9.5 mm Tortuosity-mild-to-moderate Calcification-mild Left External Iliac Artery - Minimal Diameter-9.7 x 10.2 mm Tortuosity-moderate to severe Calcification-none Left Common Femoral Artery - Minimal Diameter-10.5 x 11.4 mm Tortuosity-mild Calcification-mild Review of the MIP images confirms the above findings. IMPRESSION: 1. Vascular findings and measurements pertinent to potential TAVR procedure, as detailed above. 2. Severe thickening calcifications of the aortic valve, compatible with the reported clinical history of severe aortic stenosis. 3. Aortic atherosclerosis, in addition to left main and 3 vessel coronary artery disease. 4. Small umbilical and left inguinal hernias containing only fat. No associated bowel incarceration or obstruction at this time. 5. Additional incidental findings, as above. Electronically Signed   By: Vinnie Langton M.D.   On: 03/02/2018 15:52   Disposition   Pt is being discharged home today in good condition.  Follow-up Plans & Appointments    Follow-up Information    Eileen Stanford, PA-C. Go on 03/17/2018.   Specialties:  Cardiology, Radiology Why:  @ 3:30pm, please arrive at least 10 minutes early Contact information: [place] [address] [phone]            Discharge Medications   Allergies as of 03/11/2018     No Known Allergies     Medication List    STOP taking these medications   amLODipine 10 MG tablet Commonly known as:  NORVASC   ciprofloxacin 500 MG tablet Commonly known as:  CIPRO     TAKE these medications   aspirin 81 MG chewable tablet Chew 1 tablet (81 mg total) by mouth daily. Start taking on:  03/12/2018   clopidogrel 75 MG tablet Commonly known as:  PLAVIX Take 1  tablet (75 mg total) by mouth daily with breakfast. Start taking on:  03/12/2018   CRANBERRY PO Take 12,600 mg by mouth daily.   DHEA PO Take 45 mg by mouth daily.   lisinopril 5 MG tablet Commonly known as:  PRINIVIL,ZESTRIL Take 1 tablet (5 mg total) by mouth daily. What changed:  when to take this   Magnesium 125 MG Caps Take 250 mg by mouth every evening.   OVER THE COUNTER MEDICATION Take 320 mg by mouth 2 (two) times daily. OmegaPure   OVER THE COUNTER MEDICATION Take 1 tablet by mouth 2 (two) times daily. K2-7 Supplement   OVER THE COUNTER MEDICATION Take 1 tablet by mouth daily. Liver Protect Supplement   OVER THE COUNTER MEDICATION Take 1 capsule by mouth daily. Heart Saver Cholesterol Supplement   OVER THE COUNTER MEDICATION Take 1 tablet by mouth daily. Metabolic Synergy Supplement   OVER THE COUNTER MEDICATION Apply 1 application topically daily. Kelacream   vitamin C 500 MG tablet Commonly known as:  ASCORBIC ACID Take 1,000 mg by mouth daily.   Vitamin D3 5000 units Caps Take 5,000 Units by mouth daily.           Outstanding Labs/Studies   none  Duration of Discharge Encounter   Greater than 30 minutes including physician time.  Mable Fill, PA-C 03/11/2018, 10:07 AM [phone]  Patient seen, examined. Available data reviewed. Agree with findings, assessment, and plan as outlined by Nell Range, PA-C.  On my exam today, the patient is an alert, oriented elderly male in no distress.  Lung fields are clear, heart is regular rate and  rhythm with a grade 2/6 systolic ejection murmur at the right upper sternal border, no diastolic murmur is present.  Abdomen is soft and nontender, bilateral groin sites are clear, there is no pretibial edema.  Review of telemetry demonstrates normal sinus rhythm with bifascicular block, unchanged from baseline EKG.  The patient is done remarkably well with TAVR.  He does have mild to moderate paravalvular regurgitation likely related to very heavy annular calcification of his native aortic valve annulus.  A diastolic murmur is not present on his exam.  I suspect he will do quite well clinically.  He feels remarkably better with walking in the halls.  He should take dual antiplatelet therapy with aspirin and clopidogrel for at least 6 months.  He appears stable for discharge home today.  Otherwise as outlined above.  Sherren Mocha, M.D. 03/11/2018 11:09 AM

## 2018-03-12 ENCOUNTER — Telehealth: Payer: Self-pay | Admitting: Physician Assistant

## 2018-03-12 ENCOUNTER — Encounter: Payer: Self-pay | Admitting: Thoracic Surgery (Cardiothoracic Vascular Surgery)

## 2018-03-12 NOTE — Telephone Encounter (Signed)
  [place] VALVE TEAM   Patient contacted regarding discharge from [facility] on 03/11/18  Patient understands to follow up with provider Nell Range on 03/17/18 @ 3:30pm at [place].  Patient understands discharge instructions? yes Patient understands medications and regiment? yes Patient understands to bring all medications to this visit? yes  Angelena Form PA-C  [facility]

## 2018-03-12 NOTE — Anesthesia Postprocedure Evaluation (Signed)
Anesthesia Post Note  Patient: Robert Tapia  Procedure(s) Performed: TRANSCATHETER AORTIC VALVE REPLACEMENT, TRANSFEMORAL (N/A Chest) INTRAOPERATIVE TRANSTHORACIC ECHOCARDIOGRAM (Chest)     Patient location during evaluation: PACU Anesthesia Type: MAC Level of consciousness: awake and alert Pain management: pain level controlled Vital Signs Assessment: post-procedure vital signs reviewed and stable Respiratory status: spontaneous breathing, nonlabored ventilation, respiratory function stable and patient connected to nasal cannula oxygen Cardiovascular status: stable and blood pressure returned to baseline Postop Assessment: no apparent nausea or vomiting Anesthetic complications: no    Last Vitals:  Vitals:   03/11/18 1000 03/11/18 1100  BP: 113/73 107/79  Pulse: (!) 33 89  Resp: 20 16  Temp:    SpO2: 97% 99%    Last Pain:  Vitals:   03/11/18 0738  TempSrc: Oral  PainSc:                  [place] S

## 2018-03-16 ENCOUNTER — Telehealth (HOSPITAL_COMMUNITY): Payer: Self-pay

## 2018-03-16 NOTE — Telephone Encounter (Signed)
Pt insurance is active and benefits verified through [employer]. Co-pay $45.00, DED $0.00/$0.00 met, out of pocket $4,200.00/$543.70 met, co-insurance 0%. No pre-authorization. [employer], 03/15/18 @ 9:43AM, REF# [id number] ° °Will contact patient to see if he is interested in the Cardiac Rehab Program. If interested, patient will need to complete follow up appt. Once completed, patient will be contacted for scheduling upon review by the RN Navigator. °

## 2018-03-16 NOTE — Telephone Encounter (Signed)
Called patient to see if he is interested in the Cardiac Rehab Program. Patient stated not at this time. ° °Closed referral °

## 2018-03-17 ENCOUNTER — Encounter: Payer: Self-pay | Admitting: Physician Assistant

## 2018-03-17 ENCOUNTER — Ambulatory Visit: Payer: Medicare HMO | Admitting: Physician Assistant

## 2018-03-17 VITALS — BP 102/62 | HR 75 | Ht 70.0 in | Wt 195.0 lb

## 2018-03-17 DIAGNOSIS — I452 Bifascicular block: Secondary | ICD-10-CM

## 2018-03-17 DIAGNOSIS — Z952 Presence of prosthetic heart valve: Secondary | ICD-10-CM | POA: Diagnosis not present

## 2018-03-17 DIAGNOSIS — I10 Essential (primary) hypertension: Secondary | ICD-10-CM

## 2018-03-17 DIAGNOSIS — N189 Chronic kidney disease, unspecified: Secondary | ICD-10-CM

## 2018-03-17 MED ORDER — AMOXICILLIN 500 MG PO TABS: ORAL_TABLET | ORAL | 6 refills | Status: DC

## 2018-03-17 NOTE — Progress Notes (Signed)
[facility] [place]                                       Cardiology Office Note    Date:  03/17/2018   ID:  Robert Tapia, DOB Sep 05, 1931, MRN [id number]  PCP:  Aretta Nip, MD  Cardiologist: [name] / Dr. Burt Knack & Dr. Roxy Manns (TAVR)  CC: [facility] s/p TAVR  History of Present Illness:  Robert Tapia is a 84 y.o. male with a history of RBBB with LAFB, HLD, CKD stage III, HTN and severe AS s/p TAVR (03/09/18) who presents to clinic for follow up.  The patient has been followed for aortic stenosis for many years. His serial echo studies have demonstrated gradual progression of aortic stenosis from the severe range back in 2015 now to a critical range where his peak systolic velocity is greater than 5.5 m/s and his mean transvalvular gradient is 76 mmHg. His LV function remains normal with an ejection fraction of 65 to 70%. After extensive discussion with both Dr. Illene Bolus and Dr. Caryl Comes, he agreed to seek further work-up for consideration of TAVR. L/RHC showed no significant coronary artery disease and normal right heart pressures.   He ultimately underwent successful TAVR with a43mm Edwards Sapien 3 THV via the TF approach on 03/09/18. Post operative echo showed EF 60%, normally functioning TAVR with mild-mod PVL and mean gradient of 16 mmHg. He did not developed any conduction disturbance after TAVR. He was discharged on Aspirin and plavix. Home amlodipine 10mg  daily was discontinued given soft normal BPs while admitted.   Today he presents to clinic for follow up.  He can tell a big difference in his breathing and he no longer has a tightness in his chest. He has been walking as prescribed to cardiac rehab and increasing by incremental minutes. He is up to 10 minutes 3x a day. He is feeling like he can do even more than that.No CP or SOB. No LE edema, orthopnea or PND. No dizziness or syncope. No blood in stool or urine. No palpitations.       Past Medical History:  Diagnosis Date  . Bifascicular bundle branch block    RBBB and LAFB  . Chronic renal disease    Referred Dr. Marval Regal 08/2009  . Essential hypertension, benign   . HTN (hypertension)   . Hyperlipidemia    Pt. declines cholesterol medication (07/03/09)  . Left ventricular hypertrophy    Moderate concentric LV hypertrophy by ECHO 11/05/12  . Observation for suspected cardiovascular disease   . Pure hypercholesterolemia    LDL 164   . S/P TAVR (transcatheter aortic valve replacement) 03/09/2018   12mm Edwards Sapien 3 THV via the TF approach   . Severe aortic stenosis     Past Surgical History:  Procedure Laterality Date  . ELBOW SURGERY Right   . INTRAOPERATIVE TRANSTHORACIC ECHOCARDIOGRAM  03/09/2018   Procedure: INTRAOPERATIVE TRANSTHORACIC ECHOCARDIOGRAM;  Surgeon: Sherren Mocha, MD;  Location: [facility];  Service: Open Heart Surgery;;  . TRANSCATHETER AORTIC VALVE REPLACEMENT, TRANSFEMORAL N/A 03/09/2018   Procedure: TRANSCATHETER AORTIC VALVE REPLACEMENT, TRANSFEMORAL;  Surgeon: Sherren Mocha, MD;  Location: [facility];  Service: Open Heart Surgery;  Laterality: N/A;    Current Medications: Outpatient Medications Prior to Visit  Medication Sig Dispense Refill  . aspirin 81 MG chewable tablet Chew 1 tablet (81 mg  total) by mouth daily.    . Cholecalciferol (VITAMIN D3) 5000 units CAPS Take 5,000 Units by mouth daily.    . clopidogrel (PLAVIX) 75 MG tablet Take 1 tablet (75 mg total) by mouth daily with breakfast. 90 tablet 1  . CRANBERRY PO Take 12,600 mg by mouth daily.     Marland Kitchen lisinopril (PRINIVIL,ZESTRIL) 5 MG tablet Take 1 tablet (5 mg total) by mouth daily. 30 tablet 6  . Magnesium 125 MG CAPS Take 250 mg by mouth every evening.    . Nutritional Supplements (DHEA PO) Take 45 mg by mouth daily.     Marland Kitchen OVER THE COUNTER MEDICATION Take 320 mg by mouth 2 (two) times daily. OmegaPure     . OVER THE COUNTER MEDICATION Take 1 tablet by mouth 2 (two) times  daily. K3-7-9 Supplement    . OVER THE COUNTER MEDICATION Take 1 tablet by mouth daily. Liver Protect Supplement    . OVER THE COUNTER MEDICATION Take 1 capsule by mouth daily. Heart Saver Cholesterol Supplement     . OVER THE COUNTER MEDICATION Take 1 tablet by mouth daily. Metabolic Synergy Supplement    . OVER THE COUNTER MEDICATION Apply 1 application topically daily. Kelacream    . vitamin C (ASCORBIC ACID) 500 MG tablet Take 1,000 mg by mouth daily.      No facility-administered medications prior to visit.      Allergies:   Patient has no known allergies.   Social History   Socioeconomic History  . Marital status: Married    Spouse name: Not on file  . Number of children: Not on file  . Years of education: Not on file  . Highest education level: Not on file  Occupational History  . Not on file  Social Needs  . Financial resource strain: Not on file  . Food insecurity:    Worry: Not on file    Inability: Not on file  . Transportation needs:    Medical: Not on file    Non-medical: Not on file  Tobacco Use  . Smoking status: Never Smoker  . Smokeless tobacco: Never Used  Substance and Sexual Activity  . Alcohol use: No  . Drug use: No  . Sexual activity: Not on file  Lifestyle  . Physical activity:    Days per week: Not on file    Minutes per session: Not on file  . Stress: Not on file  Relationships  . Social connections:    Talks on phone: Not on file    Gets together: Not on file    Attends religious service: Not on file    Active member of club or organization: Not on file    Attends meetings of clubs or organizations: Not on file    Relationship status: Not on file  Other Topics Concern  . Not on file  Social History Narrative  . Not on file     Family History:  The patient's family history includes Diabetes in his mother; Heart attack in his brother.     ROS:   Please see the history of present illness.    ROS All other systems reviewed and are  negative.   PHYSICAL EXAM:   VS:  BP 102/62   Pulse 75   Ht 5\' 10"  (1.778 m)   Wt 195 lb (88.5 kg)   SpO2 94%   BMI 27.98 kg/m    GEN: Well nourished, well developed, in no acute distress HEENT: normal Neck: no JVD  or masses Cardiac: RRR; 2/6 SEM. No rubs, or gallops,no edema  Respiratory:  clear to auscultation bilaterally, normal work of breathing GI: soft, nontender, nondistended, + BS MS: no deformity or atrophy Skin: warm and dry, no rash Neuro:  Alert and Oriented x 3, Strength and sensation are intact Psych: euthymic mood, full affect   Wt Readings from Last 3 Encounters:  03/17/18 195 lb (88.5 kg)  03/11/18 191 lb 2.2 oz (86.7 kg)  03/05/18 193 lb 14.4 oz (88 kg)      Studies/Labs Reviewed:   EKG:  EKG is ordered today.  The ekg ordered today demonstrates NSR 78 with RBBB and LAFB  Recent Labs: 03/05/2018: ALT 18; B Natriuretic Peptide 126.2 03/10/2018: Magnesium 1.8 03/11/2018: BUN 16; Creatinine, Ser 1.18; Hemoglobin 13.3; Platelets 116; Potassium 3.7; Sodium 136   Lipid Panel    Component Value Date/Time   CHOL 200 11/01/2013 1101   TRIG 109.0 11/01/2013 1101   HDL 42.90 11/01/2013 1101   CHOLHDL 5 11/01/2013 1101   VLDL 21.8 11/01/2013 1101   LDLCALC 135 (H) 11/01/2013 1101    Additional studies/ records that were reviewed today include:  TAVR OPERATIVE NOTE   Date of Procedure:03/09/2018  Preoperative Diagnosis:Severe Aortic Stenosis   Postoperative Diagnosis:Same   Procedure:   Transcatheter Aortic Valve Replacement - PercutaneousLeftTransfemoral Approach Edwards Sapien 3 THV (size 59mm, model # 9600TFX, serial # [id number])  Co-Surgeons:Clarence H. Roxy Manns, MD and Sherren Mocha, MD  Pre-operative Echo Findings: ? Severe aortic stenosis ? Normalleft ventricular systolic function  Post-operative Echo Findings: ? Moderateparavalvular leak ? Unchangedleft  ventricular systolic function  ______________  Post op echocardiogram 03/10/18  Study Conclusions - Left ventricle: The cavity size was normal. Wall thickness was increased in a pattern of moderate LVH. Systolic function was normal. The estimated ejection fraction was in the range of 60% to 65%. Doppler parameters are consistent with abnormal left ventricular relaxation (grade 1 diastolic dysfunction). - Aortic valve: A bioprosthesis was present. There was mild to moderate regurgitation. Valve area (VTI): 1.91 cm^2. Valve area (Vmax): 1.88 cm^2. Valve area (Vmean): 1.89 cm^2. - Left atrium: The atrium was mildly dilated. - Right atrium: The atrium was mildly dilated.     ASSESSMENT & PLAN:   Severe AS s/p TAVR:doing excellent. Very happy with the whole procedure and all the care he has received. Groin sites are healing nicely. He is cleared to drive and resume all normal activities. Continue on ASA and plavix. Amoxicillin called in for SBE prophylaxis. I will see him back at 1 month for echo and follow up.   HTN: BP on low side today, asymptomatic. If it remains low we can probably stop his lisinopril. He will keep a log for me at home.   CKD stage III: this has remained stable. Followed by Dr. Kathe Mariner  RBBB w/ LAFB: ECG today stable with no HAVB.   Medication Adjustments/Labs and Tests Ordered: Current medicines are reviewed at length with the patient today.  Concerns regarding medicines are outlined above.  Medication changes, Labs and Tests ordered today are listed in the Patient Instructions below. Patient Instructions  Medication Instructions:  1) Katie discussed the importance of taking an antibiotic prior to any dental visit to prevent damage to the heart valves from infection. You were given a prescription for AMOXIL 2,000 mg to take one hour prior to dental visits.  Labwork: None  Testing/Procedures: None  Follow-Up: Please keep your  appointments on 04/07/2018 for your echocardiogram and office visit with  Nell Range, [place]. Please arrive by   Any Other Special Instructions Will Be Listed Below (If Applicable).     If you need a refill on your cardiac medications before your next appointment, please call your pharmacy.      Signed, Angelena Form, PA-C  03/17/2018 3:55 PM    [facility] [place], [place], [place]  [address] Phone: [phone]; Fax: [phone]

## 2018-03-17 NOTE — Patient Instructions (Addendum)
Medication Instructions:  1) Katie discussed the importance of taking an antibiotic prior to any dental visit to prevent damage to the heart valves from infection. You were given a prescription for AMOXIL 2,000 mg to take one hour prior to dental visits.  Labwork: None  Testing/Procedures: None  Follow-Up: Please keep your appointments on 04/07/2018 at 1:00PM  for your echocardiogram and office visit with Nell Range, PA.   Any Other Special Instructions Will Be Listed Below (If Applicable).     If you need a refill on your cardiac medications before your next appointment, please call your pharmacy.

## 2018-04-05 NOTE — Progress Notes (Signed)
HE[facility] AND [place]                                       Cardiology Office Note    Date:  04/08/2018   ID:  Robert Tapia, DOB [birth year], MRN [id number]  PCP:  Aretta Nip, MD  Cardiologist: [name] / Dr. Burt Knack & Dr. Roxy Manns (TAVR)  CC: 1 month s/p TAVR  History of Present Illness:  Robert Tapia is a 84 y.o. male with a history of RBBB with LAFB, HLD, CKD stage III, HTN and severe AS s/p TAVR (03/09/18) who presents to clinic for follow up.  The patient has been followed for aortic stenosis for many years. His serial echo studies have demonstrated gradual progression of aortic stenosis from the severe range back in 2015 now to a critical range where his peak systolic velocity is greater than 5.5 m/s and his mean transvalvular gradient is 76 mmHg. His LV function remains normal with an ejection fraction of 65 to 70%. After extensive discussion with both Dr. Illene Bolus and Dr. Caryl Comes, he agreed to seek further work-up for consideration of TAVR. L/RHC showed no significant coronary artery disease and normal right heart pressures.   He ultimately underwent successful TAVR with a49mm Edwards Sapien 3 THV via the TF approach on 03/09/18. Post operative echo showed EF 60%, normally functioning TAVR with mild-mod PVL and mean gradient of 16 mmHg. He did not developed any conduction disturbance after TAVR. He was discharged on Aspirin and plavix. Home amlodipine 10mg  daily was discontinued given soft normal BPs while admitted.   He has done well in follow up. BP was low at last visit. Plan was for him to keep a log and discontinue lisinopril if it remained low at home.   Today he presents to clinic for follow up. He is dong well. No CP or SOB. No LE edema, orthopnea or PND. No dizziness or syncope. No blood in stool or urine. No palpitations. He has not had any chest tightness since having his surgery. He can tell a big difference in his  breathing, especially at night. He has been walking around [employer] for at least 20 minutes. Review of BP log shows pressures are in good range.     Past Medical History:  Diagnosis Date  . Bifascicular bundle branch block    RBBB and LAFB  . Chronic renal disease    Referred Dr. Marval Regal 08/2009  . Essential hypertension, benign   . HTN (hypertension)   . Hyperlipidemia    Pt. declines cholesterol medication (07/03/09)  . Left ventricular hypertrophy    Moderate concentric LV hypertrophy by ECHO 11/05/12  . Observation for suspected cardiovascular disease   . Pure hypercholesterolemia    LDL 164   . S/P TAVR (transcatheter aortic valve replacement) 03/09/2018   22mm Edwards Sapien 3 THV via the TF approach   . Severe aortic stenosis     Past Surgical History:  Procedure Laterality Date  . ELBOW SURGERY Right   . INTRAOPERATIVE TRANSTHORACIC ECHOCARDIOGRAM  03/09/2018   Procedure: INTRAOPERATIVE TRANSTHORACIC ECHOCARDIOGRAM;  Surgeon: Sherren Mocha, MD;  Location: [facility];  Service: Open Heart Surgery;;  . TRANSCATHETER AORTIC VALVE REPLACEMENT, TRANSFEMORAL N/A 03/09/2018   Procedure: TRANSCATHETER AORTIC VALVE REPLACEMENT, TRANSFEMORAL;  Surgeon: Sherren Mocha, MD;  Location: [facility];  Service: Open Heart Surgery;  Laterality: N/A;  Current Medications: Outpatient Medications Prior to Visit  Medication Sig Dispense Refill  . amoxicillin (AMOXIL) 500 MG tablet Take 4 tablets (2,000 mg) one hour prior to dental visits. 8 tablet 6  . aspirin 81 MG chewable tablet Chew 1 tablet (81 mg total) by mouth daily.    . Cholecalciferol (VITAMIN D3) 5000 units CAPS Take 5,000 Units by mouth daily.    Marland Kitchen CRANBERRY PO Take 12,600 mg by mouth daily.     Marland Kitchen lisinopril (PRINIVIL,ZESTRIL) 5 MG tablet Take 1 tablet (5 mg total) by mouth daily. 30 tablet 6  . Magnesium 125 MG CAPS Take 250 mg by mouth every evening.    . Nutritional Supplements (DHEA PO) Take 45 mg by mouth daily.     Marland Kitchen OVER  THE COUNTER MEDICATION Take 320 mg by mouth 2 (two) times daily. OmegaPure     . OVER THE COUNTER MEDICATION Take 1 tablet by mouth 2 (two) times daily. K3-7-9 Supplement    . OVER THE COUNTER MEDICATION Take 1 tablet by mouth daily. Liver Protect Supplement    . OVER THE COUNTER MEDICATION Take 1 capsule by mouth daily. Heart Saver Cholesterol Supplement     . OVER THE COUNTER MEDICATION Take 1 tablet by mouth daily. Metabolic Synergy Supplement    . OVER THE COUNTER MEDICATION Apply 1 application topically daily. Kelacream    . vitamin C (ASCORBIC ACID) 500 MG tablet Take 1,000 mg by mouth daily.     . clopidogrel (PLAVIX) 75 MG tablet Take 1 tablet (75 mg total) by mouth daily with breakfast. 90 tablet 1   No facility-administered medications prior to visit.      Allergies:   Patient has no known allergies.   Social History   Socioeconomic History  . Marital status: Married    Spouse name: Not on file  . Number of children: Not on file  . Years of education: Not on file  . Highest education level: Not on file  Occupational History  . Not on file  Social Needs  . Financial resource strain: Not on file  . Food insecurity:    Worry: Not on file    Inability: Not on file  . Transportation needs:    Medical: Not on file    Non-medical: Not on file  Tobacco Use  . Smoking status: Never Smoker  . Smokeless tobacco: Never Used  Substance and Sexual Activity  . Alcohol use: No  . Drug use: No  . Sexual activity: Not on file  Lifestyle  . Physical activity:    Days per week: Not on file    Minutes per session: Not on file  . Stress: Not on file  Relationships  . Social connections:    Talks on phone: Not on file    Gets together: Not on file    Attends religious service: Not on file    Active member of club or organization: Not on file    Attends meetings of clubs or organizations: Not on file    Relationship status: Not on file  Other Topics Concern  . Not on file    Social History Narrative  . Not on file     Family History:  The patient's family history includes Diabetes in his mother; Heart attack in his brother.    ROS:   Please see the history of present illness.    ROS All other systems reviewed and are negative.   PHYSICAL EXAM:   VS:  BP  122/62   Pulse 80   Ht 5\' 10"  (1.778 m)   Wt 195 lb (88.5 kg)   SpO2 98%   BMI 27.98 kg/m    GEN: Well nourished, well developed, in no acute distress HEENT: normal Neck: no JVD or masses Cardiac: 2/6 SEM. No rubs, or gallops,no edema  Respiratory:  clear to auscultation bilaterally, normal work of breathing GI: soft, nontender, nondistended, + BS MS: no deformity or atrophy Skin: warm and dry, no rash Neuro:  Alert and Oriented x 3, Strength and sensation are intact Psych: euthymic mood, full affect   Wt Readings from Last 3 Encounters:  04/08/18 195 lb (88.5 kg)  03/17/18 195 lb (88.5 kg)  03/11/18 191 lb 2.2 oz (86.7 kg)      Studies/Labs Reviewed:   EKG:  EKG is NOT ordered today.   Recent Labs: 03/05/2018: ALT 18; B Natriuretic Peptide 126.2 03/10/2018: Magnesium 1.8 03/11/2018: BUN 16; Creatinine, Ser 1.18; Hemoglobin 13.3; Platelets 116; Potassium 3.7; Sodium 136   Lipid Panel    Component Value Date/Time   CHOL 200 11/01/2013 1101   TRIG 109.0 11/01/2013 1101   HDL 42.90 11/01/2013 1101   CHOLHDL 5 11/01/2013 1101   VLDL 21.8 11/01/2013 1101   LDLCALC 135 (H) 11/01/2013 1101    Additional studies/ records that were reviewed today include:  TAVR OPERATIVE NOTE   Date of Procedure:03/09/2018  Preoperative Diagnosis:Severe Aortic Stenosis   Postoperative Diagnosis:Same   Procedure:   Transcatheter Aortic Valve Replacement - PercutaneousLeftTransfemoral Approach Edwards Sapien 3 THV (size 38mm, model # 9600TFX, serial # [id number])  Co-Surgeons:Clarence H. Roxy Manns, MD and Sherren Mocha,  MD  Pre-operative Echo Findings: ? Severe aortic stenosis ? Normalleft ventricular systolic function  Post-operative Echo Findings: ? Moderateparavalvular leak ? Unchangedleft ventricular systolic function  ______________  Post op echocardiogram 03/10/18  Study Conclusions - Left ventricle: The cavity size was normal. Wall thickness was increased in a pattern of moderate LVH. Systolic function was normal. The estimated ejection fraction was in the range of 60% to 65%. Doppler parameters are consistent with abnormal left ventricular relaxation (grade 1 diastolic dysfunction). - Aortic valve: A bioprosthesis was present. There was mild to moderate regurgitation. Valve area (VTI): 1.91 cm^2. Valve area (Vmax): 1.88 cm^2. Valve area (Vmean): 1.89 cm^2. - Left atrium: The atrium was mildly dilated. - Right atrium: The atrium was mildly dilated.  ________________  Echo 04/08/18 Study Conclusions  - Left ventricle: The cavity size was normal. Wall thickness was   normal. Systolic function was vigorous. The estimated ejection   fraction was in the range of 65% to 70%. Wall motion was normal;   there were no regional wall motion abnormalities. Doppler   parameters are consistent with abnormal left ventricular   relaxation (grade 1 diastolic dysfunction). - Aortic valve: A bioprosthesis was present. There was trivial   regurgitation. Valve area (VTI): 2.03 cm^2. Valve area (Vmax):   2.04 cm^2. Valve area (Vmean): 1.82 cm^2. - Right atrium: The atrium was mildly dilated.    ASSESSMENT & PLAN:   Severe AS s/p TAVR: echo today shows EF 65%, normally functioning TAVR with mean gradient of 16 mm Hg (stable from previous report) and trivial PVL.  He has NYHA class I symptoms. SBE prophylaxis discussed; he has amoxicillin. Plavix can be discontinued after 6 months of therapy 08/2018  HTN: BP well controlled. No changes today   CKD stage III: last creat was  actually improved at 1.18  RBBB w/ LAFB: this has  remained stable.    Medication Adjustments/Labs and Tests Ordered: Current medicines are reviewed at length with the patient today.  Concerns regarding medicines are outlined above.  Medication changes, Labs and Tests ordered today are listed in the Patient Instructions below. Patient Instructions  Medication Instructions:  Your physician has recommended you make the following change in your medication:   STOP Plavix in April 2020 when you run out of pills  If you need a refill on your cardiac medications before your next appointment, please call your pharmacy.   Lab work: None Ordered    Testing/Procedures: None Ordered   Follow-Up: At [facility], you and your health needs are our priority.  As part of our continuing mission to provide you with exceptional heart care, we have created designated Provider Care Teams.  These Care Teams include your primary Cardiologist (physician) and Advanced Practice Providers (APPs -  Physician Assistants and Nurse Practitioners) who all work together to provide you with the care you need, when you need it. You will need a follow up appointment in 3 months.  Please call our office 2 months in advance to schedule this appointment.  You may see [name] or one of the following Advanced Practice Providers on your designated Care Team:   [place], PA-C Melina Copa, PA-C . Ermalinda Barrios, PA-C       Signed, Angelena Form, PA-C  04/08/2018 3:09 PM    [facility] [place], [place], [place]  [address] Phone: [phone]; Fax: [phone]

## 2018-04-07 ENCOUNTER — Other Ambulatory Visit (HOSPITAL_COMMUNITY): Payer: Medicare HMO

## 2018-04-07 ENCOUNTER — Ambulatory Visit: Payer: Medicare HMO | Admitting: Physician Assistant

## 2018-04-08 ENCOUNTER — Encounter: Payer: Self-pay | Admitting: Physician Assistant

## 2018-04-08 ENCOUNTER — Ambulatory Visit (HOSPITAL_COMMUNITY): Payer: Medicare HMO | Attending: Cardiovascular Disease

## 2018-04-08 ENCOUNTER — Ambulatory Visit: Payer: Medicare HMO | Admitting: Physician Assistant

## 2018-04-08 ENCOUNTER — Other Ambulatory Visit: Payer: Self-pay

## 2018-04-08 VITALS — BP 122/62 | HR 80 | Ht 70.0 in | Wt 195.0 lb

## 2018-04-08 DIAGNOSIS — N189 Chronic kidney disease, unspecified: Secondary | ICD-10-CM | POA: Diagnosis not present

## 2018-04-08 DIAGNOSIS — I452 Bifascicular block: Secondary | ICD-10-CM

## 2018-04-08 DIAGNOSIS — Z952 Presence of prosthetic heart valve: Secondary | ICD-10-CM

## 2018-04-08 DIAGNOSIS — I10 Essential (primary) hypertension: Secondary | ICD-10-CM

## 2018-04-08 LAB — ECHOCARDIOGRAM COMPLETE
Height: 70 in
WEIGHTICAEL: 3120 [oz_av]

## 2018-04-08 MED ORDER — CLOPIDOGREL BISULFATE 75 MG PO TABS: 75.0000 mg | ORAL_TABLET | Freq: Every day | ORAL | 1 refills | Status: DC

## 2018-04-08 NOTE — Patient Instructions (Addendum)
Medication Instructions:  Your physician has recommended you make the following change in your medication:   STOP Plavix in April 2020 when you run out of pills  If you need a refill on your cardiac medications before your next appointment, please call your pharmacy.   Lab work: None Ordered    Testing/Procedures: None Ordered   Follow-Up: At [facility], you and your health needs are our priority.  As part of our continuing mission to provide you with exceptional heart care, we have created designated Provider Care Teams.  These Care Teams include your primary Cardiologist (physician) and Advanced Practice Providers (APPs -  Physician Assistants and Nurse Practitioners) who all work together to provide you with the care you need, when you need it. You will need a follow up appointment in 3 months.  Please call our office 2 months in advance to schedule this appointment.  You may see [name] or one of the following Advanced Practice Providers on your designated Care Team:   [place], PA-C Melina Copa, PA-C . Ermalinda Barrios, PA-C

## 2018-05-24 DIAGNOSIS — N183 Chronic kidney disease, stage 3 (moderate): Secondary | ICD-10-CM | POA: Diagnosis not present

## 2018-06-04 ENCOUNTER — Encounter: Payer: Self-pay | Admitting: Thoracic Surgery (Cardiothoracic Vascular Surgery)

## 2018-06-16 DIAGNOSIS — N179 Acute kidney failure, unspecified: Secondary | ICD-10-CM | POA: Diagnosis not present

## 2018-06-16 DIAGNOSIS — N183 Chronic kidney disease, stage 3 (moderate): Secondary | ICD-10-CM | POA: Diagnosis not present

## 2018-06-16 DIAGNOSIS — Z952 Presence of prosthetic heart valve: Secondary | ICD-10-CM | POA: Diagnosis not present

## 2018-06-16 DIAGNOSIS — E7800 Pure hypercholesterolemia, unspecified: Secondary | ICD-10-CM | POA: Diagnosis not present

## 2018-06-16 DIAGNOSIS — I129 Hypertensive chronic kidney disease with stage 1 through stage 4 chronic kidney disease, or unspecified chronic kidney disease: Secondary | ICD-10-CM | POA: Diagnosis not present

## 2018-07-20 ENCOUNTER — Ambulatory Visit: Payer: Medicare HMO | Admitting: Interventional Cardiology

## 2018-07-20 ENCOUNTER — Encounter: Payer: Self-pay | Admitting: Interventional Cardiology

## 2018-07-20 VITALS — BP 140/76 | HR 84 | Ht 70.0 in | Wt 203.4 lb

## 2018-07-20 DIAGNOSIS — N189 Chronic kidney disease, unspecified: Secondary | ICD-10-CM

## 2018-07-20 DIAGNOSIS — I10 Essential (primary) hypertension: Secondary | ICD-10-CM | POA: Diagnosis not present

## 2018-07-20 DIAGNOSIS — Z952 Presence of prosthetic heart valve: Secondary | ICD-10-CM | POA: Diagnosis not present

## 2018-07-20 DIAGNOSIS — I452 Bifascicular block: Secondary | ICD-10-CM

## 2018-07-20 MED ORDER — AMOXICILLIN 500 MG PO TABS: ORAL_TABLET | ORAL | 6 refills | Status: AC

## 2018-07-20 MED ORDER — LISINOPRIL 5 MG PO TABS: 5.0000 mg | ORAL_TABLET | Freq: Every day | ORAL | 3 refills | Status: DC

## 2018-07-20 NOTE — Progress Notes (Signed)
Cardiology Office Note   Date:  07/20/2018   ID:  Robert Tapia, DOB [birth year], MRN [id number]  PCP:  Aretta Nip, MD    No chief complaint on file.  Follow-up aortic stenosis  Wt Readings from Last 3 Encounters:  07/20/18 203 lb 6.4 oz (92.3 kg)  04/08/18 195 lb (88.5 kg)  03/17/18 195 lb (88.5 kg)       History of Present Illness: Robert Tapia is a 85 y.o. male  with a history of RBBB with LAFB, HLD, CKD stage III, HTN and severe AS s/p TAVR (03/09/18).  26 mm Edwards Sapien 3 THV via the transfemoral approach.  He had aortic stenosis for many years.  Further work-up was recommended but he declined for a long time.  Finally, in 2019 he agreed.  His mean transvalvular gradient was 76 mmHg.  He was seen in clinic at the end of October 2019.  He was doing quite well at that time.  Since the last visit, he has done well.   Denies : Chest pain. Dizziness. Leg edema. Nitroglycerin use. Orthopnea. Palpitations. Paroxysmal nocturnal dyspnea. Shortness of breath. Syncope.   He walks regularly.   Past Medical History:  Diagnosis Date  . Bifascicular bundle branch block    RBBB and LAFB  . Chronic renal disease    Referred Dr. Marval Regal 08/2009  . Essential hypertension, benign   . HTN (hypertension)   . Hyperlipidemia    Pt. declines cholesterol medication (07/03/09)  . Left ventricular hypertrophy    Moderate concentric LV hypertrophy by ECHO 11/05/12  . Observation for suspected cardiovascular disease   . Pure hypercholesterolemia    LDL 164   . S/P TAVR (transcatheter aortic valve replacement) 03/09/2018   57mm Edwards Sapien 3 THV via the TF approach   . Severe aortic stenosis     Past Surgical History:  Procedure Laterality Date  . ELBOW SURGERY Right   . INTRAOPERATIVE TRANSTHORACIC ECHOCARDIOGRAM  03/09/2018   Procedure: INTRAOPERATIVE TRANSTHORACIC ECHOCARDIOGRAM;  Surgeon: Sherren Mocha, MD;  Location: [facility];  Service: Open Heart Surgery;;  .  TRANSCATHETER AORTIC VALVE REPLACEMENT, TRANSFEMORAL N/A 03/09/2018   Procedure: TRANSCATHETER AORTIC VALVE REPLACEMENT, TRANSFEMORAL;  Surgeon: Sherren Mocha, MD;  Location: [facility];  Service: Open Heart Surgery;  Laterality: N/A;     Current Outpatient Medications  Medication Sig Dispense Refill  . amoxicillin (AMOXIL) 500 MG tablet Take 4 tablets (2,000 mg) one hour prior to dental visits. 8 tablet 6  . aspirin 81 MG chewable tablet Chew 1 tablet (81 mg total) by mouth daily.    . Cholecalciferol (VITAMIN D3) 5000 units CAPS Take 5,000 Units by mouth daily.    . clopidogrel (PLAVIX) 75 MG tablet Take 1 tablet (75 mg total) by mouth daily with breakfast. 90 tablet 1  . CRANBERRY PO Take 12,600 mg by mouth daily.     Marland Kitchen lisinopril (PRINIVIL,ZESTRIL) 5 MG tablet Take 1 tablet (5 mg total) by mouth daily. 30 tablet 6  . Magnesium 125 MG CAPS Take 250 mg by mouth every evening.    . Nutritional Supplements (DHEA PO) Take 45 mg by mouth daily.     Marland Kitchen OVER THE COUNTER MEDICATION Take 320 mg by mouth 2 (two) times daily. OmegaPure     . OVER THE COUNTER MEDICATION Take 1 tablet by mouth 2 (two) times daily. K3-7-9 Supplement    . OVER THE COUNTER MEDICATION Take 1 tablet by mouth daily. Liver Protect Supplement    .  OVER THE COUNTER MEDICATION Take 1 capsule by mouth daily. Heart Saver Cholesterol Supplement     . OVER THE COUNTER MEDICATION Take 1 tablet by mouth daily. Metabolic Synergy Supplement    . OVER THE COUNTER MEDICATION Apply 1 application topically daily. Kelacream    . vitamin C (ASCORBIC ACID) 500 MG tablet Take 1,000 mg by mouth daily.      No current facility-administered medications for this visit.     Allergies:   Patient has no known allergies.    Social History:  The patient  reports that he has never smoked. He has never used smokeless tobacco. He reports that he does not drink alcohol or use drugs.   Family History:  The patient's family history includes Diabetes in his  mother; Heart attack in his brother.    ROS:  Please see the history of present illness.   Otherwise, review of systems are positive for increased exercise tolerance.   All other systems are reviewed and negative.    PHYSICAL EXAM: VS:  BP 140/76   Pulse 84   Ht 5\' 10"  (1.778 m)   Wt 203 lb 6.4 oz (92.3 kg)   SpO2 95%   BMI 29.18 kg/m  , BMI Body mass index is 29.18 kg/m. GEN: Well nourished, well developed, in no acute distress  HEENT: normal  Neck: no JVD, carotid bruits, or masses Cardiac: RRR; 2/6 early systolic murmur, no rubs, or gallops,no edema  Respiratory:  clear to auscultation bilaterally, normal work of breathing GI: soft, nontender, nondistended, + BS MS: no deformity or atrophy  Skin: warm and dry, no rash Neuro:  Strength and sensation are intact Psych: euthymic mood, full affect   EKG:   The ekg ordered in 10/19 demonstrates NSR, bifasc block   Recent Labs: 03/05/2018: ALT 18; B Natriuretic Peptide 126.2 03/10/2018: Magnesium 1.8 03/11/2018: BUN 16; Creatinine, Ser 1.18; Hemoglobin 13.3; Platelets 116; Potassium 3.7; Sodium 136   Lipid Panel    Component Value Date/Time   CHOL 200 11/01/2013 1101   TRIG 109.0 11/01/2013 1101   HDL 42.90 11/01/2013 1101   CHOLHDL 5 11/01/2013 1101   VLDL 21.8 11/01/2013 1101   LDLCALC 135 (H) 11/01/2013 1101     Other studies Reviewed: Additional studies/ records that were reviewed today with results demonstrating: Hospital records reviewed.   ASSESSMENT AND PLAN:  1. Status post TAVR: Will prescribe amoxicillin for SBE prophylaxis.  I stressed the importance of dental cleanings.  He continues to feel an improvement since the valve procedure.  Plavix post TAVR 2. Hypertension: Fairly well controlled. Continue to monitor.  3. CKD stage III: Cr 1.1 in 10/19. 4. Right bundle branch block: stable 5. Hyperlipidemia: LDL 140.  He is exercising more now after valve procedure.  Could consider statin in the future if  LDL stays above 130.    Current medicines are reviewed at length with the patient today.  The patient concerns regarding his medicines were addressed.  The following changes have been made:  No change  Labs/ tests ordered today include:  No orders of the defined types were placed in this encounter.   Recommend 150 minutes/week of aerobic exercise Low fat, low carb, high fiber diet recommended  Disposition:   FU in 1 year   Signed, Larae Grooms, MD  07/20/2018 [facility] [address], [place], [place]  [address] Phone: [phone]; Fax: [phone]

## 2018-07-20 NOTE — Patient Instructions (Signed)
Medication Instructions:  Your physician recommends that you continue on your current medications as directed. Please refer to the Current Medication list given to you today.  Your physician discussed the importance of taking an antibiotic prior to any dental, gastrointestinal, genitourinary procedures to prevent damage to the heart valves from infection. You were given a prescription for an antibiotic based on current SBE prophylaxis guidelines.   If you need a refill on your cardiac medications before your next appointment, please call your pharmacy.   Lab work: None Ordered  If you have labs (blood work) drawn today and your tests are completely normal, you will receive your results only by: Marland Kitchen MyChart Message (if you have MyChart) OR . A paper copy in the mail If you have any lab test that is abnormal or we need to change your treatment, we will call you to review the results.  Testing/Procedures: None ordered  Follow-Up: At [facility], you and your health needs are our priority.  As part of our continuing mission to provide you with exceptional heart care, we have created designated Provider Care Teams.  These Care Teams include your primary Cardiologist (physician) and Advanced Practice Providers (APPs -  Physician Assistants and Nurse Practitioners) who all work together to provide you with the care you need, when you need it. . You will need a follow up appointment in 1 year.  Please call our office 2 months in advance to schedule this appointment.  You may see Casandra Doffing, MD or one of the following Advanced Practice Providers on your designated Care Team:   . Lyda Jester, PA-C . Dayna Dunn, PA-C . Ermalinda Barrios, PA-C  Any Other Special Instructions Will Be Listed Below (If Applicable).

## 2018-08-23 DIAGNOSIS — Z952 Presence of prosthetic heart valve: Secondary | ICD-10-CM | POA: Diagnosis not present

## 2018-08-23 DIAGNOSIS — I10 Essential (primary) hypertension: Secondary | ICD-10-CM | POA: Diagnosis not present

## 2018-08-23 DIAGNOSIS — Z0000 Encounter for general adult medical examination without abnormal findings: Secondary | ICD-10-CM | POA: Diagnosis not present

## 2018-08-23 DIAGNOSIS — N183 Chronic kidney disease, stage 3 (moderate): Secondary | ICD-10-CM | POA: Diagnosis not present

## 2018-08-23 DIAGNOSIS — E7800 Pure hypercholesterolemia, unspecified: Secondary | ICD-10-CM | POA: Diagnosis not present

## 2018-10-07 DIAGNOSIS — Z8249 Family history of ischemic heart disease and other diseases of the circulatory system: Secondary | ICD-10-CM | POA: Diagnosis not present

## 2018-10-07 DIAGNOSIS — I350 Nonrheumatic aortic (valve) stenosis: Secondary | ICD-10-CM | POA: Diagnosis not present

## 2018-10-07 DIAGNOSIS — Z7982 Long term (current) use of aspirin: Secondary | ICD-10-CM | POA: Diagnosis not present

## 2018-10-07 DIAGNOSIS — Z833 Family history of diabetes mellitus: Secondary | ICD-10-CM | POA: Diagnosis not present

## 2018-10-07 DIAGNOSIS — I10 Essential (primary) hypertension: Secondary | ICD-10-CM | POA: Diagnosis not present

## 2018-12-02 DIAGNOSIS — N183 Chronic kidney disease, stage 3 (moderate): Secondary | ICD-10-CM | POA: Diagnosis not present

## 2019-02-09 DIAGNOSIS — N179 Acute kidney failure, unspecified: Secondary | ICD-10-CM | POA: Diagnosis not present

## 2019-02-09 DIAGNOSIS — E669 Obesity, unspecified: Secondary | ICD-10-CM | POA: Diagnosis not present

## 2019-02-09 DIAGNOSIS — I350 Nonrheumatic aortic (valve) stenosis: Secondary | ICD-10-CM | POA: Diagnosis not present

## 2019-02-09 DIAGNOSIS — N183 Chronic kidney disease, stage 3 (moderate): Secondary | ICD-10-CM | POA: Diagnosis not present

## 2019-02-09 DIAGNOSIS — I10 Essential (primary) hypertension: Secondary | ICD-10-CM | POA: Diagnosis not present

## 2019-02-09 DIAGNOSIS — E7800 Pure hypercholesterolemia, unspecified: Secondary | ICD-10-CM | POA: Diagnosis not present

## 2019-02-09 DIAGNOSIS — R600 Localized edema: Secondary | ICD-10-CM | POA: Diagnosis not present

## 2019-03-10 ENCOUNTER — Telehealth: Payer: Self-pay | Admitting: Physician Assistant

## 2019-03-10 NOTE — Telephone Encounter (Signed)
  [facility] VALVE TEAM  Patient called several times to arrange 1 year TAVR follow up. Left a voicemail two weeks ago and again today with no call back. Will continue to try to reach patient.   Angelena Form PA-C  [facility]

## 2019-03-31 ENCOUNTER — Other Ambulatory Visit: Payer: Self-pay | Admitting: Physician Assistant

## 2019-03-31 DIAGNOSIS — Z952 Presence of prosthetic heart valve: Secondary | ICD-10-CM

## 2019-04-30 DIAGNOSIS — K429 Umbilical hernia without obstruction or gangrene: Secondary | ICD-10-CM | POA: Diagnosis not present

## 2019-04-30 DIAGNOSIS — M6208 Separation of muscle (nontraumatic), other site: Secondary | ICD-10-CM | POA: Diagnosis not present

## 2019-05-02 NOTE — Progress Notes (Signed)
[facility]                                       Cardiology Office Note    Date:  05/04/2019   ID:  Robert Tapia, DOB December 27, 1931, MRN [id number]  PCP:  Aretta Nip, MD  Cardiologist: [name] / Dr. Burt Knack & Dr. Roxy Manns (TAVR)  CC: 1 year s/p TAVR  History of Present Illness:  Robert Tapia is a 85 y.o. male with a history of RBBB with LAFB, HLD, CKD stage III, HTN and severe AS s/p TAVR (03/09/18) who presents to clinic for follow up.  The patient has been followed for aortic stenosis for many years. His serial echo studies have demonstrated gradual progression of aortic stenosis from the severe range back in 2015 now to a critical range where his peak systolic velocity is greater than 5.5 m/s and his mean transvalvular gradient is 76 mmHg. His LV function remains normal with an ejection fraction of 65 to 70%. After extensive discussion with both Dr. Illene Bolus and Dr. Caryl Comes, he agreed to seek further work-up for consideration of TAVR. L/RHC showed no significant coronary artery disease and normal right heart pressures.   He ultimately underwent successful TAVR with a6mm Edwards Sapien 3 THV via the TF approach on 03/09/18. Post operative echo showed EF 60%, normally functioning TAVR with mild-mod PVL and mean gradient of 16 mmHg. He did not develop any conduction disturbance after TAVR. He was discharged on Aspirin and plavix.   Today he presents to clinic for follow up. His wife had a hip replacement 3 months and he has been very busy doing extra work to help her. No CP or SOB. No LE edema, orthopnea or PND. No dizziness or syncope. No blood in stool or urine. No palpitations. No fatigue or weakness.     Past Medical History:  Diagnosis Date  . Bifascicular bundle branch block    RBBB and LAFB  . Chronic renal disease    Referred Dr. Marval Regal 08/2009  . Essential hypertension, benign   . HTN (hypertension)   .  Hyperlipidemia    Pt. declines cholesterol medication (07/03/09)  . Left ventricular hypertrophy    Moderate concentric LV hypertrophy by ECHO 11/05/12  . Observation for suspected cardiovascular disease   . Pure hypercholesterolemia    LDL 164   . S/P TAVR (transcatheter aortic valve replacement) 03/09/2018   24mm Edwards Sapien 3 THV via the TF approach   . Severe aortic stenosis     Past Surgical History:  Procedure Laterality Date  . ELBOW SURGERY Right   . INTRAOPERATIVE TRANSTHORACIC ECHOCARDIOGRAM  03/09/2018   Procedure: INTRAOPERATIVE TRANSTHORACIC ECHOCARDIOGRAM;  Surgeon: Sherren Mocha, MD;  Location: [facility];  Service: Open Heart Surgery;;  . TRANSCATHETER AORTIC VALVE REPLACEMENT, TRANSFEMORAL N/A 03/09/2018   Procedure: TRANSCATHETER AORTIC VALVE REPLACEMENT, TRANSFEMORAL;  Surgeon: Sherren Mocha, MD;  Location: [facility];  Service: Open Heart Surgery;  Laterality: N/A;    Current Medications: Outpatient Medications Prior to Visit  Medication Sig Dispense Refill  . amoxicillin (AMOXIL) 500 MG tablet Take 4 tablets (2,000 mg) one hour prior to dental visits. 4 tablet 6  . aspirin 81 MG chewable tablet Chew 1 tablet (81 mg total) by mouth daily.    . Cholecalciferol (VITAMIN D3) 5000 units CAPS Take 5,000 Units by mouth daily.    Marland Kitchen  clopidogrel (PLAVIX) 75 MG tablet Take 1 tablet (75 mg total) by mouth daily with breakfast. 90 tablet 1  . CRANBERRY PO Take 12,600 mg by mouth daily.     Marland Kitchen lisinopril (ZESTRIL) 2.5 MG tablet Take 2.5 mg by mouth daily.    . Magnesium 125 MG CAPS Take 250 mg by mouth every evening.    . Nutritional Supplements (DHEA PO) Take 45 mg by mouth daily.     Marland Kitchen OVER THE COUNTER MEDICATION Take 320 mg by mouth 2 (two) times daily. OmegaPure     . OVER THE COUNTER MEDICATION Take 1 tablet by mouth 2 (two) times daily. K3-7-9 Supplement    . OVER THE COUNTER MEDICATION Take 1 tablet by mouth daily. Liver Protect Supplement    . OVER THE COUNTER MEDICATION  Take 1 capsule by mouth daily. Heart Saver Cholesterol Supplement     . OVER THE COUNTER MEDICATION Take 1 tablet by mouth daily. Metabolic Synergy Supplement    . OVER THE COUNTER MEDICATION Apply 1 application topically daily. Kelacream    . vitamin C (ASCORBIC ACID) 500 MG tablet Take 1,000 mg by mouth daily.     Marland Kitchen lisinopril (PRINIVIL,ZESTRIL) 5 MG tablet Take 1 tablet (5 mg total) by mouth daily. (Patient not taking: Reported on 05/04/2019) 90 tablet 3   No facility-administered medications prior to visit.     Allergies:   Patient has no known allergies.   Social History   Socioeconomic History  . Marital status: Married    Spouse name: Not on file  . Number of children: Not on file  . Years of education: Not on file  . Highest education level: Not on file  Occupational History  . Not on file  Tobacco Use  . Smoking status: Never Smoker  . Smokeless tobacco: Never Used  Substance and Sexual Activity  . Alcohol use: No  . Drug use: No  . Sexual activity: Not on file  Other Topics Concern  . Not on file  Social History Narrative  . Not on file   Social Determinants of Health   Financial Resource Strain:   . Difficulty of Paying Living Expenses: Not on file  Food Insecurity:   . Worried About Charity fundraiser in the Last Year: Not on file  . Ran Out of Food in the Last Year: Not on file  Transportation Needs:   . Lack of Transportation (Medical): Not on file  . Lack of Transportation (Non-Medical): Not on file  Physical Activity:   . Days of Exercise per Week: Not on file  . Minutes of Exercise per Session: Not on file  Stress:   . Feeling of Stress : Not on file  Social Connections:   . Frequency of Communication with Friends and Family: Not on file  . Frequency of Social Gatherings with Friends and Family: Not on file  . Attends Religious Services: Not on file  . Active Member of Clubs or Organizations: Not on file  . Attends Archivist Meetings:  Not on file  . Marital Status: Not on file     Family History:  The patient's family history includes Diabetes in his mother; Heart attack in his brother.    ROS:   Please see the history of present illness.    ROS All other systems reviewed and are negative.   PHYSICAL EXAM:   VS:  BP 140/78   Pulse 74   Ht 5\' 10"  (1.778 m)  Wt 209 lb (94.8 kg)   SpO2 98%   BMI 29.99 kg/m    GEN: Well nourished, well developed, in no acute distress HEENT: normal Neck: no JVD or masses Cardiac: 2/6 SEM. No rubs, or gallops,no edema  Respiratory:  clear to auscultation bilaterally, normal work of breathing GI: soft, nontender, nondistended, + BS MS: no deformity or atrophy Skin: warm and dry, no rash Neuro:  Alert and Oriented x 3, Strength and sensation are intact Psych: euthymic mood, full affect   Wt Readings from Last 3 Encounters:  05/04/19 209 lb (94.8 kg)  07/20/18 203 lb 6.4 oz (92.3 kg)  04/08/18 195 lb (88.5 kg)      Studies/Labs Reviewed:   EKG:  EKG is ordered today NSR with RBBB + LAFB HR 74 bpm  Recent Labs: No results found for requested labs within last 8760 hours.   Lipid Panel    Component Value Date/Time   CHOL 200 11/01/2013 1101   TRIG 109.0 11/01/2013 1101   HDL 42.90 11/01/2013 1101   CHOLHDL 5 11/01/2013 1101   VLDL 21.8 11/01/2013 1101   LDLCALC 135 (H) 11/01/2013 1101    Additional studies/ records that were reviewed today include:  TAVR OPERATIVE NOTE   Date of Procedure:03/09/2018  Preoperative Diagnosis:Severe Aortic Stenosis   Postoperative Diagnosis:Same   Procedure:   Transcatheter Aortic Valve Replacement - PercutaneousLeftTransfemoral Approach Edwards Sapien 3 THV (size 73mm, model # 9600TFX, serial # [id number])  Co-Surgeons:Clarence H. Roxy Manns, MD and Sherren Mocha, MD  Pre-operative Echo Findings: ? Severe aortic stenosis ? Normalleft ventricular  systolic function  Post-operative Echo Findings: ? Moderateparavalvular leak ? Unchangedleft ventricular systolic function  ______________  Post op echocardiogram 03/10/18  Study Conclusions - Left ventricle: The cavity size was normal. Wall thickness was increased in a pattern of moderate LVH. Systolic function was normal. The estimated ejection fraction was in the range of 60% to 65%. Doppler parameters are consistent with abnormal left ventricular relaxation (grade 1 diastolic dysfunction). - Aortic valve: A bioprosthesis was present. There was mild to moderate regurgitation. Valve area (VTI): 1.91 cm^2. Valve area (Vmax): 1.88 cm^2. Valve area (Vmean): 1.89 cm^2. - Left atrium: The atrium was mildly dilated. - Right atrium: The atrium was mildly dilated.  ________________  Echo 04/08/18 Study Conclusions - Left ventricle: The cavity size was normal. Wall thickness was   normal. Systolic function was vigorous. The estimated ejection   fraction was in the range of 65% to 70%. Wall motion was normal;   there were no regional wall motion abnormalities. Doppler   parameters are consistent with abnormal left ventricular   relaxation (grade 1 diastolic dysfunction). - Aortic valve: A bioprosthesis was present. There was trivial   regurgitation. Valve area (VTI): 2.03 cm^2. Valve area (Vmax):   2.04 cm^2. Valve area (Vmean): 1.82 cm^2. - Right atrium: The atrium was mildly dilated.  ________________   ASSESSMENT & PLAN:   Severe AS s/p TAVR: doing excellent with NYHA class I symptom.  Unfortunately, there was some confusion around his echo today and he missed his appointment.  This will be rescheduled.  He has amoxicillin for SBE prophylaxis.  He will continue on a baby aspirin alone.  HTN:  BP initially elevated, but 128/80 on my personal recheck.  Continue current regimen.  CKD stage III: Followed by Dr. Marval Regal.    RBBB w/ LAFB: ECG shows stable  bifascicular block. Not on any AV nodal blocking agents. Continue to monitor.   Medication Adjustments/Labs and  Tests Ordered: Current medicines are reviewed at length with the patient today.  Concerns regarding medicines are outlined above.  Medication changes, Labs and Tests ordered today are listed in the Patient Instructions below. Patient Instructions  Please call us if you need anything!    Signed, Angelena Form, PA-C  05/04/2019 5:01 PM    [facility] [place], [place], [place]  [address] Phone: [phone]; Fax: [phone]

## 2019-05-04 ENCOUNTER — Other Ambulatory Visit: Payer: Self-pay

## 2019-05-04 ENCOUNTER — Ambulatory Visit (HOSPITAL_COMMUNITY): Payer: Medicare HMO

## 2019-05-04 ENCOUNTER — Encounter: Payer: Self-pay | Admitting: Physician Assistant

## 2019-05-04 ENCOUNTER — Ambulatory Visit: Payer: Medicare HMO | Admitting: Physician Assistant

## 2019-05-04 VITALS — BP 140/78 | HR 74 | Ht 70.0 in | Wt 209.0 lb

## 2019-05-04 DIAGNOSIS — Z952 Presence of prosthetic heart valve: Secondary | ICD-10-CM | POA: Diagnosis not present

## 2019-05-04 DIAGNOSIS — N189 Chronic kidney disease, unspecified: Secondary | ICD-10-CM | POA: Diagnosis not present

## 2019-05-04 DIAGNOSIS — I10 Essential (primary) hypertension: Secondary | ICD-10-CM

## 2019-05-04 DIAGNOSIS — I452 Bifascicular block: Secondary | ICD-10-CM

## 2019-05-04 NOTE — Patient Instructions (Signed)
Please call us if you need anything.

## 2019-05-09 DIAGNOSIS — N183 Chronic kidney disease, stage 3 unspecified: Secondary | ICD-10-CM | POA: Diagnosis not present

## 2019-05-16 ENCOUNTER — Other Ambulatory Visit: Payer: Self-pay

## 2019-05-16 ENCOUNTER — Ambulatory Visit (HOSPITAL_COMMUNITY): Payer: Medicare HMO | Attending: Cardiology

## 2019-05-16 DIAGNOSIS — Z952 Presence of prosthetic heart valve: Secondary | ICD-10-CM | POA: Diagnosis not present

## 2019-07-26 DIAGNOSIS — N183 Chronic kidney disease, stage 3 unspecified: Secondary | ICD-10-CM | POA: Diagnosis not present

## 2019-08-04 DIAGNOSIS — E669 Obesity, unspecified: Secondary | ICD-10-CM | POA: Diagnosis not present

## 2019-08-04 DIAGNOSIS — Z952 Presence of prosthetic heart valve: Secondary | ICD-10-CM | POA: Diagnosis not present

## 2019-08-04 DIAGNOSIS — N179 Acute kidney failure, unspecified: Secondary | ICD-10-CM | POA: Diagnosis not present

## 2019-08-04 DIAGNOSIS — I129 Hypertensive chronic kidney disease with stage 1 through stage 4 chronic kidney disease, or unspecified chronic kidney disease: Secondary | ICD-10-CM | POA: Diagnosis not present

## 2019-08-04 DIAGNOSIS — N183 Chronic kidney disease, stage 3 unspecified: Secondary | ICD-10-CM | POA: Diagnosis not present

## 2019-08-07 NOTE — Progress Notes (Signed)
Cardiology Office Note   Date:  08/08/2019   ID:  Robert Tapia, DOB [birth year], MRN [id number]  PCP:  Aretta Nip, MD    No chief complaint on file.  Aortic stenosis, status post TAVR  Wt Readings from Last 3 Encounters:  08/08/19 219 lb (99.3 kg)  05/04/19 209 lb (94.8 kg)  07/20/18 203 lb 6.4 oz (92.3 kg)       History of Present Illness: Robert Tapia is a 86 y.o. male  with a history of RBBB with LAFB, HLD, CKD stage III, HTN and severe ASs/p TAVR (03/09/18).  26 mm Edwards Sapien 3 THV via the transfemoral approach.  He had aortic stenosis for many years.  Further work-up was recommended but he declined for a long time.  Finally, in 2019 he agreed.  His mean transvalvular gradient was 76 mmHg.  Wife had a hip replacement in 2020.    Since the last visit, he continues to walk.  [place] at the mall.   Denies : Chest pain. Dizziness. Leg edema. Nitroglycerin use. Orthopnea. Palpitations. Paroxysmal nocturnal dyspnea. Shortness of breath. Syncope.   Still has to go to the dentist.   He is not interested in a COVID vaccine.     Past Medical History:  Diagnosis Date  . Bifascicular bundle branch block    RBBB and LAFB  . Chronic renal disease    Referred Dr. Marval Regal 08/2009  . Essential hypertension, benign   . HTN (hypertension)   . Hyperlipidemia    Pt. declines cholesterol medication (07/03/09)  . Left ventricular hypertrophy    Moderate concentric LV hypertrophy by ECHO 11/05/12  . Observation for suspected cardiovascular disease   . Pure hypercholesterolemia    LDL 164   . S/P TAVR (transcatheter aortic valve replacement) 03/09/2018   51mm Edwards Sapien 3 THV via the TF approach   . Severe aortic stenosis     Past Surgical History:  Procedure Laterality Date  . ELBOW SURGERY Right   . INTRAOPERATIVE TRANSTHORACIC ECHOCARDIOGRAM  03/09/2018   Procedure: INTRAOPERATIVE TRANSTHORACIC ECHOCARDIOGRAM;  Surgeon: Sherren Mocha, MD;   Location: [place];  Service: Open Heart Surgery;;  . TRANSCATHETER AORTIC VALVE REPLACEMENT, TRANSFEMORAL N/A 03/09/2018   Procedure: TRANSCATHETER AORTIC VALVE REPLACEMENT, TRANSFEMORAL;  Surgeon: Sherren Mocha, MD;  Location: [place];  Service: Open Heart Surgery;  Laterality: N/A;     Current Outpatient Medications  Medication Sig Dispense Refill  . amoxicillin (AMOXIL) 500 MG tablet Take 4 tablets (2,000 mg) one hour prior to dental visits. 4 tablet 6  . aspirin 81 MG chewable tablet Chew 1 tablet (81 mg total) by mouth daily.    . Cholecalciferol (VITAMIN D3) 5000 units CAPS Take 5,000 Units by mouth daily.    Marland Kitchen CRANBERRY PO Take 12,600 mg by mouth daily.     Marland Kitchen lisinopril (ZESTRIL) 2.5 MG tablet Take 2.5 mg by mouth daily.    . Magnesium 125 MG CAPS Take 250 mg by mouth every evening.    . Nutritional Supplements (DHEA PO) Take 45 mg by mouth daily.     Marland Kitchen OVER THE COUNTER MEDICATION Take 320 mg by mouth 2 (two) times daily. OmegaPure     . OVER THE COUNTER MEDICATION Take 1 tablet by mouth 2 (two) times daily. K3-7-9 Supplement    . OVER THE COUNTER MEDICATION Take 1 tablet by mouth daily. Liver Protect Supplement    . OVER THE COUNTER MEDICATION Take 1 capsule by mouth daily.  Heart Saver Cholesterol Supplement     . OVER THE COUNTER MEDICATION Take 1 tablet by mouth daily. Metabolic Synergy Supplement    . OVER THE COUNTER MEDICATION Apply 1 application topically daily. Kelacream    . vitamin C (ASCORBIC ACID) 500 MG tablet Take 1,000 mg by mouth daily.      No current facility-administered medications for this visit.    Allergies:   Patient has no known allergies.    Social History:  The patient  reports that he has never smoked. He has never used smokeless tobacco. He reports that he does not drink alcohol or use drugs.   Family History:  The patient's family history includes Diabetes in his mother; Heart attack in his brother.    ROS:  Please see the history of present  illness.   Otherwise, review of systems are positive for using compression stockings or LE edema.   All other systems are reviewed and negative.    PHYSICAL EXAM: VS:  BP 126/70   Pulse 88   Ht 5\' 10"  (1.778 m)   Wt 219 lb (99.3 kg)   SpO2 94%   BMI 31.42 kg/m  , BMI Body mass index is 31.42 kg/m. GEN: Well nourished, well developed, in no acute distress  HEENT: normal  Neck: no JVD, carotid bruits, or masses Cardiac:RRR; 2/6 systolic murmur; 2/6 holodiastolic murmur, no rubs, or gallops,tr pretibial LE edema  Respiratory:  clear to auscultation bilaterally, normal work of breathing GI: soft, nontender, nondistended, + BS MS: no deformity or atrophy  Skin: warm and dry, no rash Neuro:  Strength and sensation are intact Psych: euthymic mood, full affect   EKG:   The ekg ordered 04/2019 demonstrates NSR, bifascicular block   Recent Labs: No results found for requested labs within last 8760 hours.   Lipid Panel    Component Value Date/Time   CHOL 200 11/01/2013 1101   TRIG 109.0 11/01/2013 1101   HDL 42.90 11/01/2013 1101   CHOLHDL 5 11/01/2013 1101   VLDL 21.8 11/01/2013 1101   LDLCALC 135 (H) 11/01/2013 1101     Other studies Reviewed: Additional studies/ records that were reviewed today with results demonstrating: 2020 echo reviewed .   ASSESSMENT AND PLAN:  1. S/p TAVR: He will need SBE prophylaxis.  He needs regular dental cleanings as well.  Although not noted on echo, exam indicates some AI. No CHF sx.  2. Hypertension: The current medical regimen is effective;  continue present plan and medications. 3. Stage III CKD: Followed with Dr. Marval Regal.  Cr 1.6 in 04/2019. 4. Right bundle branch block: Stable.  Given TAVR, there was a risk of complete heart block, but no sign of this. 5. Hyperlipidemia: Consider statin if LDL stays above 130.  He tries to follow a diet by a Dr. Sharol Roussel to help his lipids.  He takes a fair amount of supplements.  Get lipids checked in  June 2021.    Current medicines are reviewed at length with the patient today.  The patient concerns regarding his medicines were addressed.  The following changes have been made:  No change  Labs/ tests ordered today include:  No orders of the defined types were placed in this encounter.   Recommend 150 minutes/week of aerobic exercise Low fat, low carb, high fiber diet recommended  Disposition:   FU in 1 year   Signed, Larae Grooms, MD  08/08/2019 1:31 PM    [address] [address],  [place], [place]  [address] Phone: [phone]; Fax: [phone]

## 2019-08-08 ENCOUNTER — Ambulatory Visit: Payer: Medicare HMO | Admitting: Interventional Cardiology

## 2019-08-08 ENCOUNTER — Other Ambulatory Visit: Payer: Self-pay

## 2019-08-08 ENCOUNTER — Encounter: Payer: Self-pay | Admitting: Interventional Cardiology

## 2019-08-08 VITALS — BP 126/70 | HR 88 | Ht 70.0 in | Wt 219.0 lb

## 2019-08-08 DIAGNOSIS — N189 Chronic kidney disease, unspecified: Secondary | ICD-10-CM | POA: Diagnosis not present

## 2019-08-08 DIAGNOSIS — I10 Essential (primary) hypertension: Secondary | ICD-10-CM

## 2019-08-08 DIAGNOSIS — E782 Mixed hyperlipidemia: Secondary | ICD-10-CM

## 2019-08-08 DIAGNOSIS — Z952 Presence of prosthetic heart valve: Secondary | ICD-10-CM | POA: Diagnosis not present

## 2019-08-08 DIAGNOSIS — I452 Bifascicular block: Secondary | ICD-10-CM | POA: Diagnosis not present

## 2019-08-08 NOTE — Patient Instructions (Signed)
Medication Instructions:  Your physician recommends that you continue on your current medications as directed. Please refer to the Current Medication list given to you today.  *If you need a refill on your cardiac medications before your next appointment, please call your pharmacy*   Lab Work: Please have fasting LIPIDS scheduled with your Primary Care Doctor  If you have labs (blood work) drawn today and your tests are completely normal, you will receive your results only by: Marland Kitchen MyChart Message (if you have MyChart) OR . A paper copy in the mail If you have any lab test that is abnormal or we need to change your treatment, we will call you to review the results.   Testing/Procedures: None ordered   Follow-Up: At [facility], you and your health needs are our priority.  As part of our continuing mission to provide you with exceptional heart care, we have created designated Provider Care Teams.  These Care Teams include your primary Cardiologist (physician) and Advanced Practice Providers (APPs -  Physician Assistants and Nurse Practitioners) who all work together to provide you with the care you need, when you need it.  We recommend signing up for the patient portal called "MyChart".  Sign up information is provided on this After Visit Summary.  MyChart is used to connect with patients for Virtual Visits (Telemedicine).  Patients are able to view lab/test results, encounter notes, upcoming appointments, etc.  Non-urgent messages can be sent to your provider as well.   To learn more about what you can do with MyChart, go to [email or web address].    Your next appointment:   12 month(s)  The format for your next appointment:   In Person  Provider:   You may see Larae Grooms, MD or one of the following Advanced Practice Providers on your designated Care Team:    Melina Copa, PA-C  Ermalinda Barrios, PA-C    Other Instructions

## 2019-08-24 ENCOUNTER — Other Ambulatory Visit: Payer: Self-pay | Admitting: Interventional Cardiology

## 2019-08-24 DIAGNOSIS — E785 Hyperlipidemia, unspecified: Secondary | ICD-10-CM | POA: Diagnosis not present

## 2019-08-25 LAB — LIPID PANEL W/O CHOL/HDL RATIO
Cholesterol, Total: 218 mg/dL — ABNORMAL HIGH (ref 100–199)
HDL: 37 mg/dL — ABNORMAL LOW (ref >39)
LDL Chol Calc (NIH): 159 mg/dL — ABNORMAL HIGH (ref 0–99)
Triglycerides: 123 mg/dL (ref 0–149)
VLDL Cholesterol Cal: 22 mg/dL (ref 5–40)

## 2019-08-25 LAB — SPECIMEN STATUS REPORT

## 2019-10-26 DIAGNOSIS — N183 Chronic kidney disease, stage 3 unspecified: Secondary | ICD-10-CM | POA: Diagnosis not present

## 2019-10-27 DIAGNOSIS — K08409 Partial loss of teeth, unspecified cause, unspecified class: Secondary | ICD-10-CM | POA: Diagnosis not present

## 2019-10-27 DIAGNOSIS — Z833 Family history of diabetes mellitus: Secondary | ICD-10-CM | POA: Diagnosis not present

## 2019-10-27 DIAGNOSIS — Z008 Encounter for other general examination: Secondary | ICD-10-CM | POA: Diagnosis not present

## 2019-10-27 DIAGNOSIS — E663 Overweight: Secondary | ICD-10-CM | POA: Diagnosis not present

## 2019-10-27 DIAGNOSIS — Z6829 Body mass index (BMI) 29.0-29.9, adult: Secondary | ICD-10-CM | POA: Diagnosis not present

## 2019-10-27 DIAGNOSIS — Z7982 Long term (current) use of aspirin: Secondary | ICD-10-CM | POA: Diagnosis not present

## 2019-10-27 DIAGNOSIS — I2510 Atherosclerotic heart disease of native coronary artery without angina pectoris: Secondary | ICD-10-CM | POA: Diagnosis not present

## 2019-10-27 DIAGNOSIS — N189 Chronic kidney disease, unspecified: Secondary | ICD-10-CM | POA: Diagnosis not present

## 2019-10-27 DIAGNOSIS — Z8249 Family history of ischemic heart disease and other diseases of the circulatory system: Secondary | ICD-10-CM | POA: Diagnosis not present

## 2019-10-27 DIAGNOSIS — I129 Hypertensive chronic kidney disease with stage 1 through stage 4 chronic kidney disease, or unspecified chronic kidney disease: Secondary | ICD-10-CM | POA: Diagnosis not present

## 2019-10-27 DIAGNOSIS — G8929 Other chronic pain: Secondary | ICD-10-CM | POA: Diagnosis not present

## 2019-12-12 IMAGING — CT CT HEART MORP W/ CTA COR W/ SCORE W/ CA W/CM &/OR W/O CM
1 of 2 series · 7 of 17 positions shown, 9 images · non-contrast
Comparison: Chest CT 12/16/2013.

EXAM:
OVER-READ INTERPRETATION  CT CHEST

The following report is an over-read performed by radiologist Dr.
over-read does not include interpretation of cardiac or coronary
anatomy or pathology. The cardiac CTA interpretation by the
cardiologist is attached.
CLINICAL DATA: 86-year-old male with severe aortic stenosis being
evaluated for a TAVR procedure.
Cardiac TAVR CT
TECHNIQUE: The patient was scanned on a Phillips Force scanner. A 120 kV
retrospective scan was triggered in the descending thoracic aorta at
111 HU's. Gantry rotation speed was 250 msecs and collimation was .6
mm. No beta blockade or nitro were given. The 3D data set was
reconstructed in 5% intervals of the R-R cycle. Systolic and
diastolic phases were analyzed on a dedicated work station using
MPR, MIP and VRT modes. The patient received 80 cc of contrast.

[Series 730: (person_name) - tavr · 0.47mm/px · 7 of 9 slices shown, 9 images]
[im 2/9  vessel]
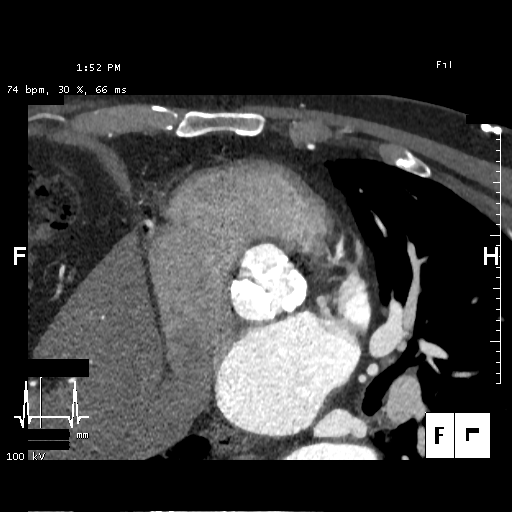
[im 2/9  lung]
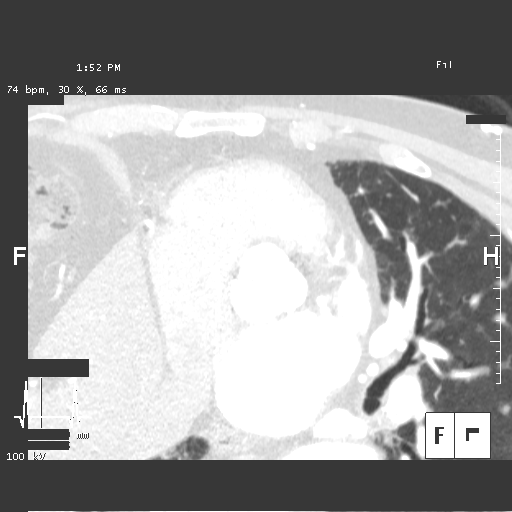
[im 3/9  vessel]
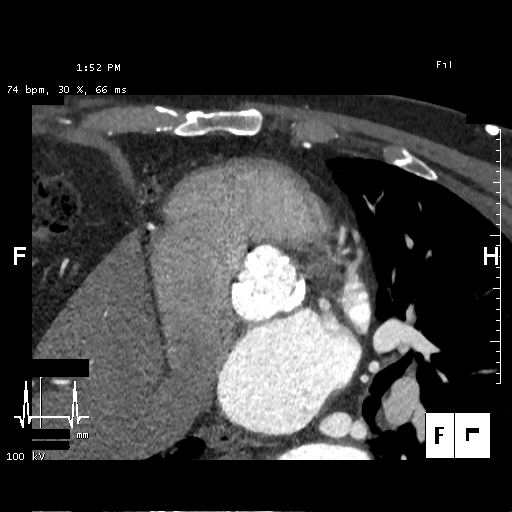
[im 4/9  vessel]
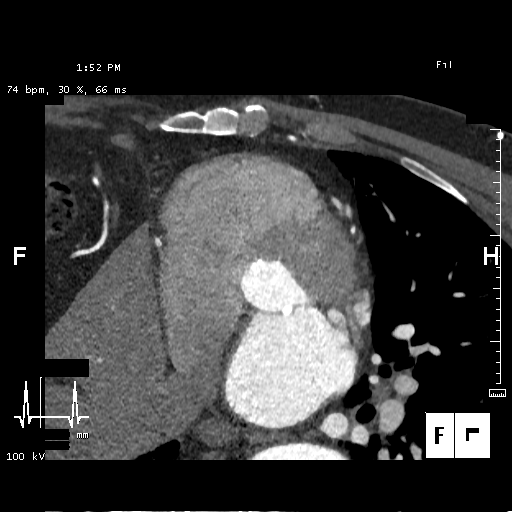
[im 5/9  vessel]
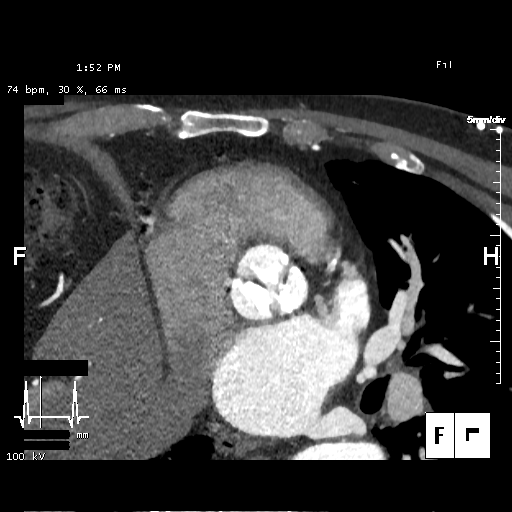
[im 6/9  vessel]
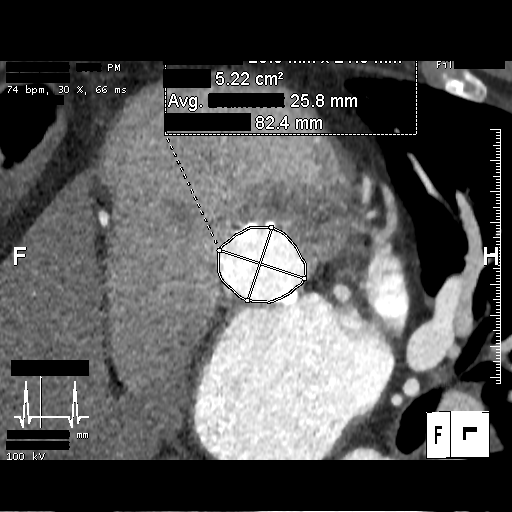
[im 6/9  lung]
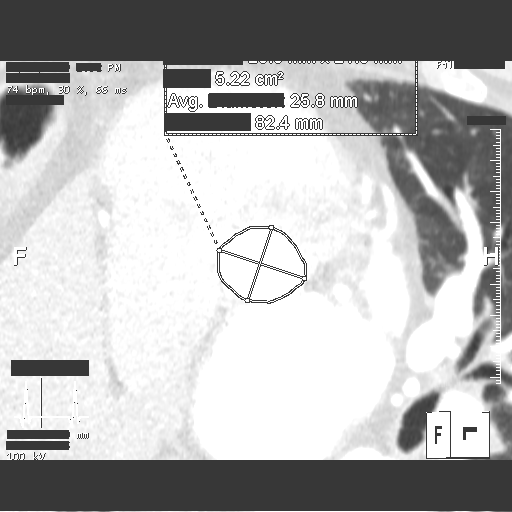
[im 7/9  vessel]
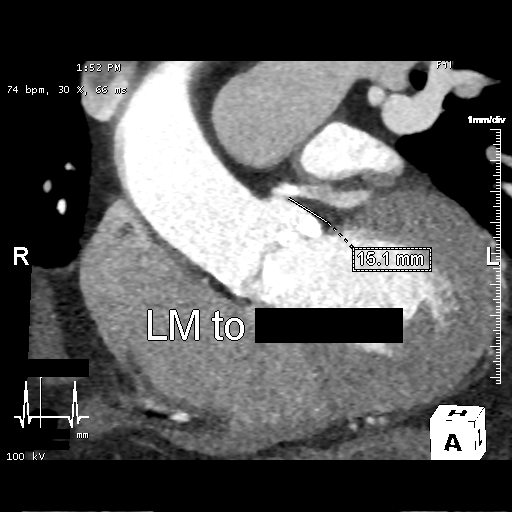
[im 8/9  vessel]
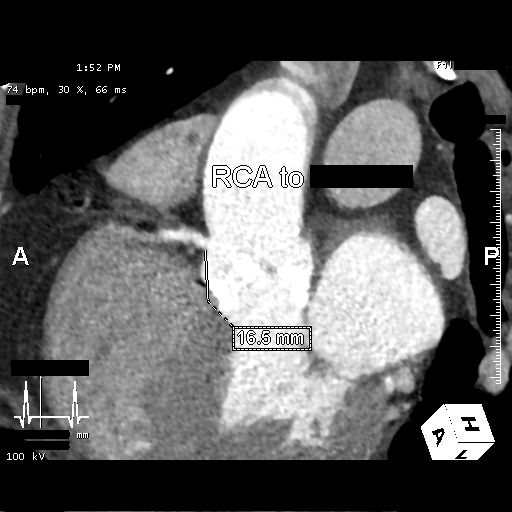

[7 of 17 positions shown; findings below may reference images not displayed]

FINDINGS: Extracardiac findings will be described separately under dictation
for contemporaneously obtained CTA chest, abdomen and pelvis.
IMPRESSION: Please see separate dictation for contemporaneously obtained CTA
chest, abdomen and pelvis dated 03/02/2018 for full description of
relevant extracardiac findings.
FINDINGS: Aortic Valve: Trileaflet aortic valve with severe thickening and
calcifications with only mild calcifications extending into the
LVOT.

Aorta: Normal size, with mild diffuse calcifications and atheroma
and no dissection.

Sinotubular Junction: 29 x 27 mm

Ascending Thoracic Aorta: 35 x 34 mm

Aortic Arch: 30 x 28 mm

Descending Thoracic Aorta: 28 x 27 mm

Sinus of Valsalva Measurements:

Non-coronary: 35 mm

Right -coronary: 35 mm

Left -coronary: 35 mm

Coronary Artery Height above Annulus:

Left Main: 15 mm

Right Coronary: 16.5 mm

Virtual Basal Annulus Measurements:

Maximum/Minimum Diameter: 28.9 x 24.6 mm

Mean Diameter: 25.8 mm

Perimeter: 82.4 mm

Area: 522 mm2

Optimum Fluoroscopic Angle for Delivery: LAO 2 HOAN 1
IMPRESSION: 1. Trileaflet aortic valve with severe thickening and calcifications
with only mild calcifications extending into the LVOT. Annular
measurements suitable for delivery of a 26 mm Edwards-SAPIEN 3 valve
by area measurement but rather 29 mm valve by diameter measurements.

2. Sufficient coronary to annulus distance.

3. Optimum Fluoroscopic Angle for Delivery:  LAO 2 HOAN 1

4. No thrombus in the left atrial appendage.

## 2019-12-12 IMAGING — CT CT ANGIO CHEST
2 of 7 series · 16 of 36 positions shown · IV contrast (APPLIED)
Comparison: Chest CT 12/16/2013.

CLINICAL DATA: 86-year-old male with history of severe symptomatic
aortic stenosis. Preprocedural study prior to potential
transcatheter aortic valve replacement (TAVR) procedure.

EXAM:
CT ANGIOGRAPHY CHEST, ABDOMEN AND PELVIS
TECHNIQUE: Multidetector CT imaging through the chest, abdomen and pelvis was
performed using the standard protocol during bolus administration of
intravenous contrast. Multiplanar reconstructed images and MIPs were
obtained and reviewed to evaluate the vascular anatomy.
CONTRAST:  100mL N6H1UI-WVW IOPAMIDOL ([id number]-WVW) INJECTION 76%

[Series 5: ax thins · axial · 0.77mm/px · z∈[+593,+1185]mm · 15 of 668 slices shown]
[im 38/668  lung]
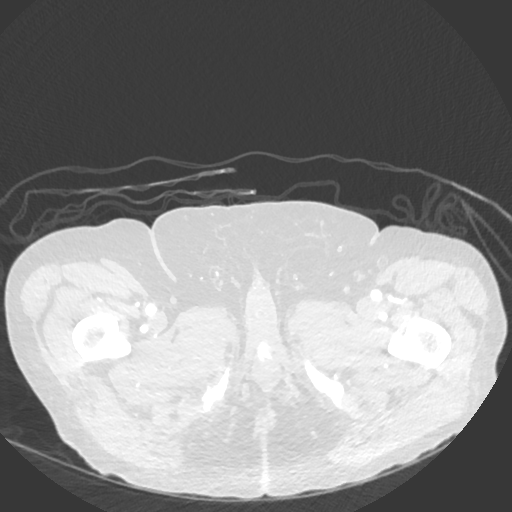
[im 75/668  mediastinal]
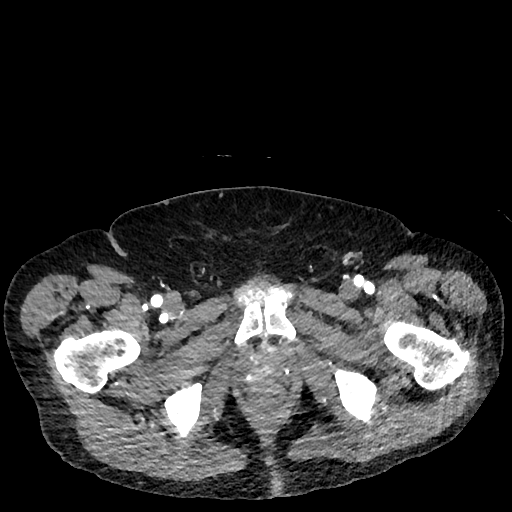
[im 112/668  lung]
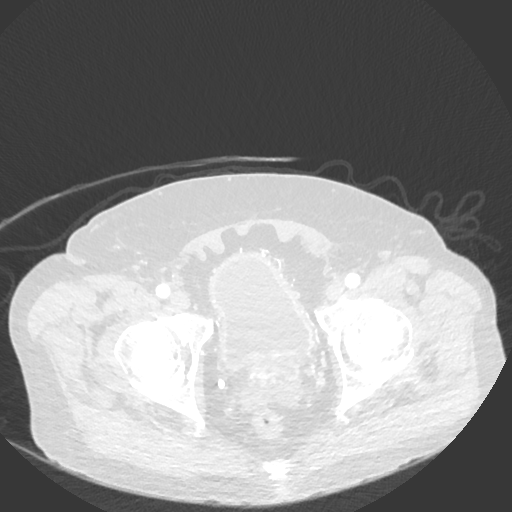
[im 149/668  mediastinal]
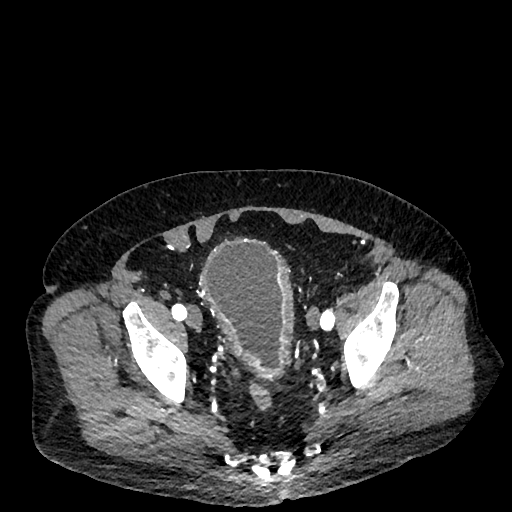
[im 223/668  lung]
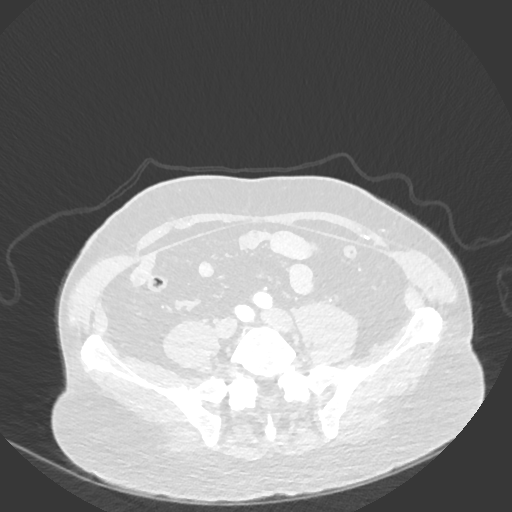
[im 260/668  mediastinal]
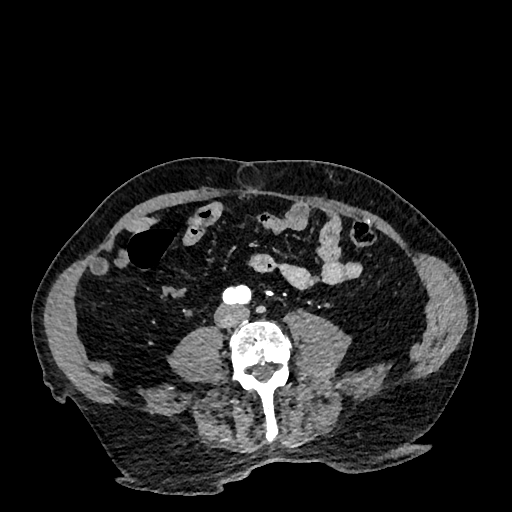
[im 297/668  lung]
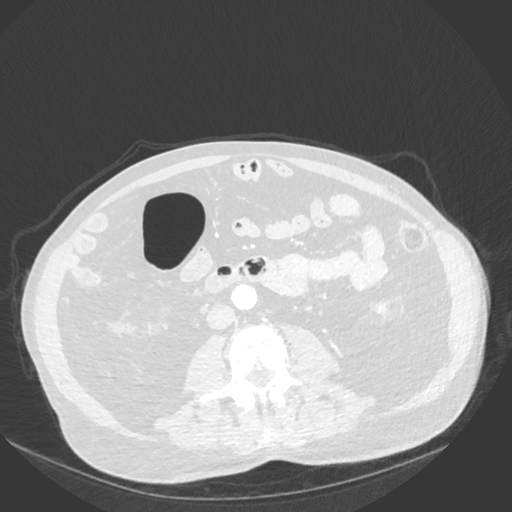
[im 334/668  mediastinal]
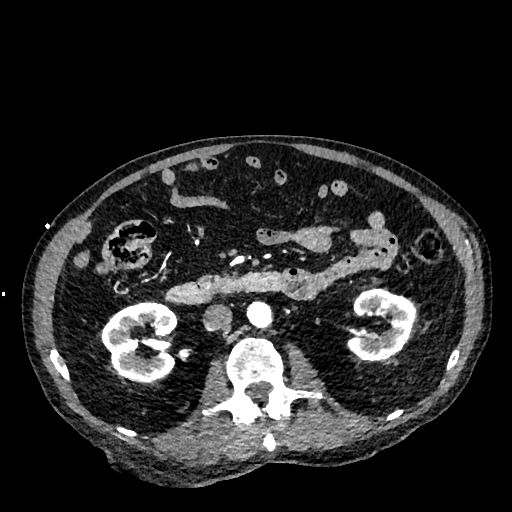
[im 371/668  lung]
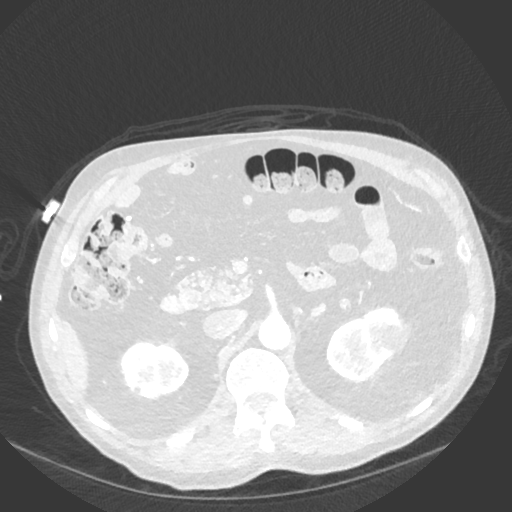
[im 408/668  mediastinal]
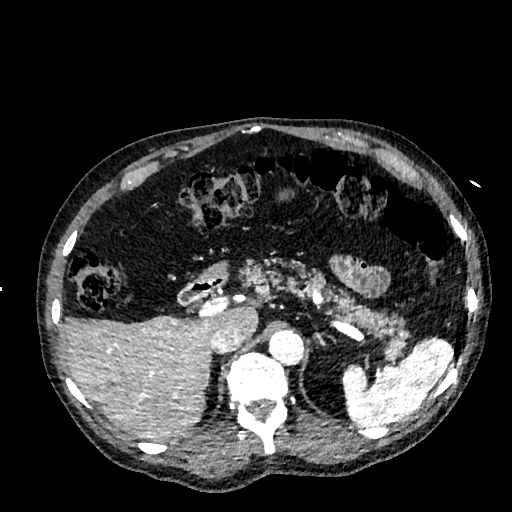
[im 445/668  lung]
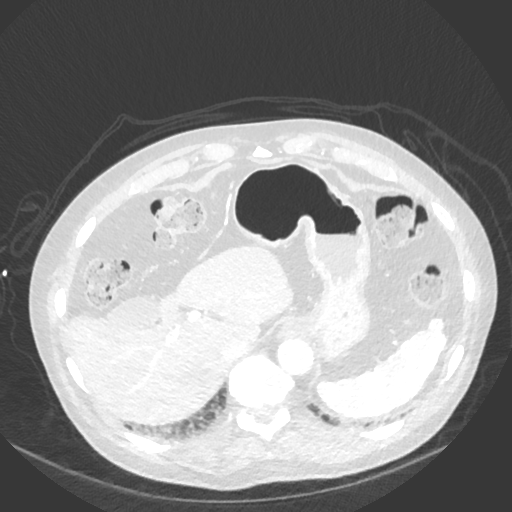
[im 519/668  mediastinal]
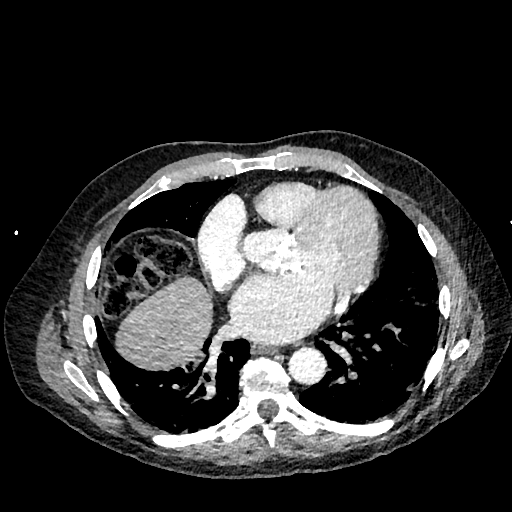
[im 556/668  lung]
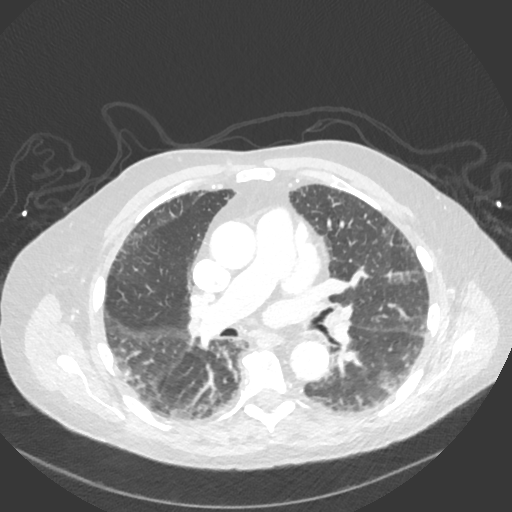
[im 593/668  mediastinal]
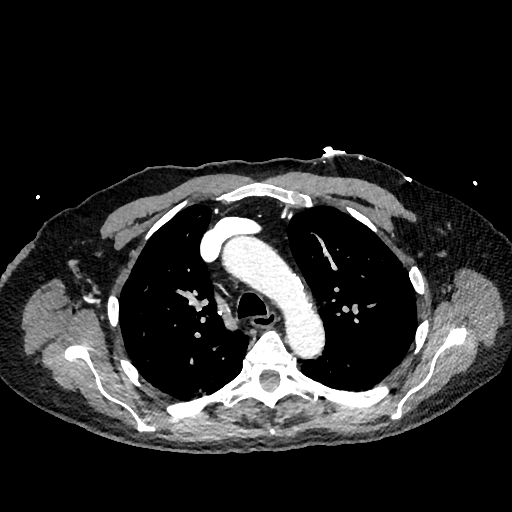
[im 630/668  lung]
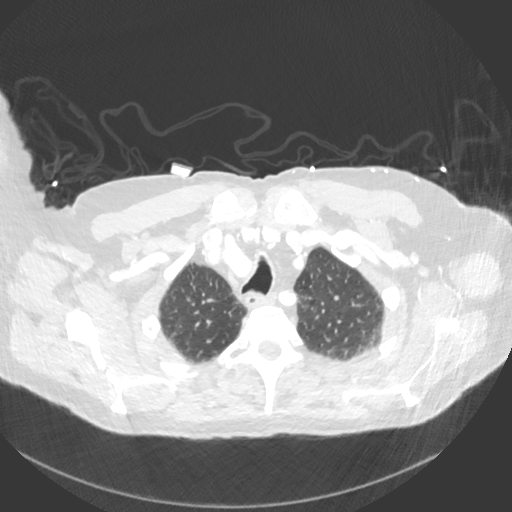

[Series 8: cor · coronal · 0.86mm/px · 1 of 162 slices shown]
[im 81/162  mediastinal]
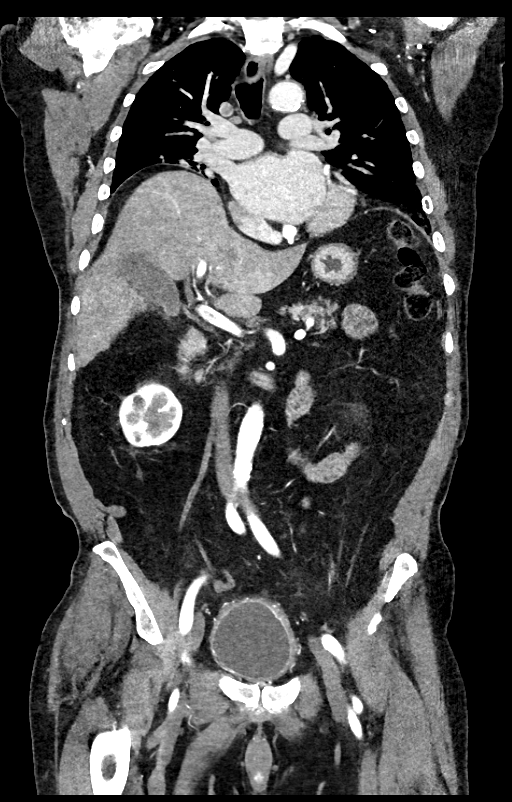

[16 of 36 positions shown; findings below may reference images not displayed]

FINDINGS: CTA CHEST FINDINGS

Cardiovascular: Heart size is borderline enlarged. There is no
significant pericardial fluid, thickening or pericardial
calcification. There is aortic atherosclerosis, as well as
atherosclerosis of the great vessels of the mediastinum and the
coronary arteries, including calcified atherosclerotic plaque in the
left main, left anterior descending, left circumflex and right
coronary arteries. Severe thickening and calcification of the aortic
valve. Calcifications of the mitral-aortic intervalvular fibrosa.

Mediastinum/Lymph Nodes: No pathologically enlarged mediastinal or
hilar lymph nodes. Esophagus is unremarkable in appearance. No
axillary lymphadenopathy.

Lungs/Pleura: No acute consolidative airspace disease. No pleural
effusions. Mild scarring in the lung bases bilaterally. No
suspicious appearing pulmonary nodules or masses are noted.

Musculoskeletal/Soft Tissues: There are no aggressive appearing
lytic or blastic lesions noted in the visualized portions of the
skeleton.

CTA ABDOMEN AND PELVIS FINDINGS

Hepatobiliary: No suspicious cystic or solid hepatic lesions. No
intra or extrahepatic biliary ductal dilatation. Gallbladder is
unremarkable in appearance.

Pancreas: No pancreatic mass. No pancreatic ductal dilatation. No
pancreatic or peripancreatic fluid or inflammatory changes.

Spleen: Unremarkable.

Adrenals/Urinary Tract: Subcentimeter low-attenuation lesions in
both kidneys, too small to characterize, but statistically likely to
represent cysts. Other low-attenuation lesions in the kidneys
bilaterally are compatible with simple cysts, largest of which
measures 2.7 cm in the upper pole the left kidney. No suspicious
renal lesions. Bilateral adrenal glands are normal in appearance. No
hydroureteronephrosis. Urinary bladder is unremarkable in
appearance..

Stomach/Bowel: Normal appearance of the stomach. No pathologic
dilatation of small bowel or colon. Normal appendix.

Vascular/Lymphatic: Aortic atherosclerosis, without evidence of
aneurysm or dissection in the abdominal or pelvic vasculature.
Vascular findings and measurements pertinent to potential TAVR
procedure, as detailed below. No lymphadenopathy noted in the
abdomen or pelvis.

Reproductive: Prostate gland and seminal vesicles are unremarkable
in appearance.

Other: Left inguinal hernia containing only fat. Small umbilical
hernia containing only omental fat. No significant volume of
ascites. No pneumoperitoneum.

Musculoskeletal: There are no aggressive appearing lytic or blastic
lesions noted in the visualized portions of the skeleton.

VASCULAR MEASUREMENTS PERTINENT TO TAVR:

AORTA:

Minimal Aortic Diameter-CX x 17 mm

Severity of Aortic Calcification-mild

RIGHT PELVIS:

Right Common Iliac Artery -

Minimal Kiameter-FF.0 x 10.9 mm

Tortuosity-mild

Calcification-mild

Right External Iliac Artery -

Minimal Ziameter-WY.Y x 12.0 mm

Tortuosity-moderate

Calcification-mild

Right Common Femoral Artery -

Minimal Uiameter-VH.T x 10.7 mm

Tortuosity-mild

Calcification-mild

LEFT PELVIS:

Left Common Iliac Artery -

Minimal Fiameter-FF.M x 9.5 mm

Tortuosity-mild-to-moderate

Calcification-mild

Left External Iliac Artery -

Minimal Oiameter-M.C x 10.2 mm

Tortuosity-moderate to severe

Calcification-none

Left Common Femoral Artery -

Minimal 6iameter-Z3.U x 11.4 mm

Tortuosity-mild

Calcification-mild

Review of the MIP images confirms the above findings.
IMPRESSION: 1. Vascular findings and measurements pertinent to potential TAVR
procedure, as detailed above.
2. Severe thickening calcifications of the aortic valve, compatible
with the reported clinical history of severe aortic stenosis.
3. Aortic atherosclerosis, in addition to left main and 3 vessel
coronary artery disease.
4. Small umbilical and left inguinal hernias containing only fat. No
associated bowel incarceration or obstruction at this time.
5. Additional incidental findings, as above.

## 2020-01-24 DIAGNOSIS — N183 Chronic kidney disease, stage 3 unspecified: Secondary | ICD-10-CM | POA: Diagnosis not present

## 2020-02-01 DIAGNOSIS — E669 Obesity, unspecified: Secondary | ICD-10-CM | POA: Diagnosis not present

## 2020-02-01 DIAGNOSIS — I10 Essential (primary) hypertension: Secondary | ICD-10-CM | POA: Diagnosis not present

## 2020-02-01 DIAGNOSIS — E7800 Pure hypercholesterolemia, unspecified: Secondary | ICD-10-CM | POA: Diagnosis not present

## 2020-02-01 DIAGNOSIS — Z952 Presence of prosthetic heart valve: Secondary | ICD-10-CM | POA: Diagnosis not present

## 2020-02-01 DIAGNOSIS — N183 Chronic kidney disease, stage 3 unspecified: Secondary | ICD-10-CM | POA: Diagnosis not present

## 2020-02-08 DIAGNOSIS — Z2821 Immunization not carried out because of patient refusal: Secondary | ICD-10-CM | POA: Diagnosis not present

## 2020-02-08 DIAGNOSIS — Z952 Presence of prosthetic heart valve: Secondary | ICD-10-CM | POA: Diagnosis not present

## 2020-02-08 DIAGNOSIS — Z0000 Encounter for general adult medical examination without abnormal findings: Secondary | ICD-10-CM | POA: Diagnosis not present

## 2020-02-08 DIAGNOSIS — I10 Essential (primary) hypertension: Secondary | ICD-10-CM | POA: Diagnosis not present

## 2020-02-08 DIAGNOSIS — M25512 Pain in left shoulder: Secondary | ICD-10-CM | POA: Diagnosis not present

## 2020-02-08 DIAGNOSIS — E7800 Pure hypercholesterolemia, unspecified: Secondary | ICD-10-CM | POA: Diagnosis not present

## 2020-02-08 DIAGNOSIS — M25511 Pain in right shoulder: Secondary | ICD-10-CM | POA: Diagnosis not present

## 2020-02-08 DIAGNOSIS — D229 Melanocytic nevi, unspecified: Secondary | ICD-10-CM | POA: Diagnosis not present

## 2020-02-08 DIAGNOSIS — N183 Chronic kidney disease, stage 3 unspecified: Secondary | ICD-10-CM | POA: Diagnosis not present

## 2020-02-08 DIAGNOSIS — R5381 Other malaise: Secondary | ICD-10-CM | POA: Diagnosis not present

## 2020-02-21 DIAGNOSIS — M25512 Pain in left shoulder: Secondary | ICD-10-CM | POA: Diagnosis not present

## 2020-02-21 DIAGNOSIS — M25511 Pain in right shoulder: Secondary | ICD-10-CM | POA: Diagnosis not present

## 2020-02-24 DIAGNOSIS — M25511 Pain in right shoulder: Secondary | ICD-10-CM | POA: Diagnosis not present

## 2020-02-24 DIAGNOSIS — M25512 Pain in left shoulder: Secondary | ICD-10-CM | POA: Diagnosis not present

## 2020-02-28 DIAGNOSIS — M25512 Pain in left shoulder: Secondary | ICD-10-CM | POA: Diagnosis not present

## 2020-02-28 DIAGNOSIS — M25511 Pain in right shoulder: Secondary | ICD-10-CM | POA: Diagnosis not present

## 2020-03-02 DIAGNOSIS — M25512 Pain in left shoulder: Secondary | ICD-10-CM | POA: Diagnosis not present

## 2020-03-02 DIAGNOSIS — M25511 Pain in right shoulder: Secondary | ICD-10-CM | POA: Diagnosis not present

## 2020-03-06 DIAGNOSIS — M25512 Pain in left shoulder: Secondary | ICD-10-CM | POA: Diagnosis not present

## 2020-03-06 DIAGNOSIS — M25511 Pain in right shoulder: Secondary | ICD-10-CM | POA: Diagnosis not present

## 2020-03-09 DIAGNOSIS — M25512 Pain in left shoulder: Secondary | ICD-10-CM | POA: Diagnosis not present

## 2020-03-09 DIAGNOSIS — M25511 Pain in right shoulder: Secondary | ICD-10-CM | POA: Diagnosis not present

## 2020-03-13 DIAGNOSIS — M25512 Pain in left shoulder: Secondary | ICD-10-CM | POA: Diagnosis not present

## 2020-03-13 DIAGNOSIS — M25511 Pain in right shoulder: Secondary | ICD-10-CM | POA: Diagnosis not present

## 2020-03-16 DIAGNOSIS — M25511 Pain in right shoulder: Secondary | ICD-10-CM | POA: Diagnosis not present

## 2020-03-16 DIAGNOSIS — M25512 Pain in left shoulder: Secondary | ICD-10-CM | POA: Diagnosis not present

## 2020-03-20 DIAGNOSIS — M25512 Pain in left shoulder: Secondary | ICD-10-CM | POA: Diagnosis not present

## 2020-03-20 DIAGNOSIS — M25511 Pain in right shoulder: Secondary | ICD-10-CM | POA: Diagnosis not present

## 2020-03-23 DIAGNOSIS — M25511 Pain in right shoulder: Secondary | ICD-10-CM | POA: Diagnosis not present

## 2020-03-23 DIAGNOSIS — M25512 Pain in left shoulder: Secondary | ICD-10-CM | POA: Diagnosis not present

## 2020-03-27 DIAGNOSIS — M25512 Pain in left shoulder: Secondary | ICD-10-CM | POA: Diagnosis not present

## 2020-03-27 DIAGNOSIS — M25511 Pain in right shoulder: Secondary | ICD-10-CM | POA: Diagnosis not present

## 2020-03-30 DIAGNOSIS — M25512 Pain in left shoulder: Secondary | ICD-10-CM | POA: Diagnosis not present

## 2020-03-30 DIAGNOSIS — M25511 Pain in right shoulder: Secondary | ICD-10-CM | POA: Diagnosis not present

## 2020-04-03 DIAGNOSIS — M25512 Pain in left shoulder: Secondary | ICD-10-CM | POA: Diagnosis not present

## 2020-04-03 DIAGNOSIS — M25511 Pain in right shoulder: Secondary | ICD-10-CM | POA: Diagnosis not present

## 2020-04-06 DIAGNOSIS — M25512 Pain in left shoulder: Secondary | ICD-10-CM | POA: Diagnosis not present

## 2020-04-06 DIAGNOSIS — M25511 Pain in right shoulder: Secondary | ICD-10-CM | POA: Diagnosis not present

## 2020-04-26 DIAGNOSIS — N183 Chronic kidney disease, stage 3 unspecified: Secondary | ICD-10-CM | POA: Diagnosis not present

## 2020-05-24 DIAGNOSIS — M25511 Pain in right shoulder: Secondary | ICD-10-CM | POA: Diagnosis not present

## 2020-05-24 DIAGNOSIS — G8929 Other chronic pain: Secondary | ICD-10-CM | POA: Diagnosis not present

## 2020-05-24 DIAGNOSIS — M25512 Pain in left shoulder: Secondary | ICD-10-CM | POA: Diagnosis not present

## 2020-06-21 DIAGNOSIS — D2239 Melanocytic nevi of other parts of face: Secondary | ICD-10-CM | POA: Diagnosis not present

## 2020-06-21 DIAGNOSIS — D220 Melanocytic nevi of lip: Secondary | ICD-10-CM | POA: Diagnosis not present

## 2020-06-21 DIAGNOSIS — L570 Actinic keratosis: Secondary | ICD-10-CM | POA: Diagnosis not present

## 2020-06-21 DIAGNOSIS — L821 Other seborrheic keratosis: Secondary | ICD-10-CM | POA: Diagnosis not present

## 2020-06-21 DIAGNOSIS — M19012 Primary osteoarthritis, left shoulder: Secondary | ICD-10-CM | POA: Diagnosis not present

## 2020-06-21 DIAGNOSIS — M19011 Primary osteoarthritis, right shoulder: Secondary | ICD-10-CM | POA: Diagnosis not present

## 2020-06-21 DIAGNOSIS — D692 Other nonthrombocytopenic purpura: Secondary | ICD-10-CM | POA: Diagnosis not present

## 2020-06-21 DIAGNOSIS — C44319 Basal cell carcinoma of skin of other parts of face: Secondary | ICD-10-CM | POA: Diagnosis not present

## 2020-06-21 DIAGNOSIS — D224 Melanocytic nevi of scalp and neck: Secondary | ICD-10-CM | POA: Diagnosis not present

## 2020-07-23 DIAGNOSIS — N183 Chronic kidney disease, stage 3 unspecified: Secondary | ICD-10-CM | POA: Diagnosis not present

## 2020-07-30 DIAGNOSIS — M19012 Primary osteoarthritis, left shoulder: Secondary | ICD-10-CM | POA: Diagnosis not present

## 2020-07-30 DIAGNOSIS — M19011 Primary osteoarthritis, right shoulder: Secondary | ICD-10-CM | POA: Diagnosis not present

## 2020-08-05 NOTE — Progress Notes (Deleted)
Cardiology Office Note   Date:  08/05/2020   ID:  Robert Tapia, DOB 1931-06-07, MRN [id number]  PCP:  Aretta Nip, MD    No chief complaint on file.  Status post TAVR  Wt Readings from Last 3 Encounters:  08/08/19 219 lb (99.3 kg)  05/04/19 209 lb (94.8 kg)  07/20/18 203 lb 6.4 oz (92.3 kg)       History of Present Illness: Robert Tapia is a 87 y.o. male   with a history of RBBB with LAFB, HLD, CKD stage III, HTN and severe ASs/p TAVR (03/09/18).26 mm Edwards Sapien 3THV via the transfemoral approach.  He had aortic stenosis for many years. Further work-up was recommended but he declined for a long time. Finally, in 2019 he agreed. His mean transvalvular gradient was 76 mmHg.  Wife had a hip replacement in 2020.      Had not been getting routine dental care which we had talked about on previous visits.  He is also not been interested in a Covid vaccine.    Past Medical History:  Diagnosis Date  . Bifascicular bundle branch block    RBBB and LAFB  . Chronic renal disease    Referred Dr. Marval Regal 08/2009  . Essential hypertension, benign   . HTN (hypertension)   . Hyperlipidemia    Pt. declines cholesterol medication (07/03/09)  . Left ventricular hypertrophy    Moderate concentric LV hypertrophy by ECHO 11/05/12  . Observation for suspected cardiovascular disease   . Pure hypercholesterolemia    LDL 164   . S/P TAVR (transcatheter aortic valve replacement) 03/09/2018   16mm Edwards Sapien 3 THV via the TF approach   . Severe aortic stenosis     Past Surgical History:  Procedure Laterality Date  . ELBOW SURGERY Right   . INTRAOPERATIVE TRANSTHORACIC ECHOCARDIOGRAM  03/09/2018   Procedure: INTRAOPERATIVE TRANSTHORACIC ECHOCARDIOGRAM;  Surgeon: Sherren Mocha, MD;  Location: [place];  Service: Open Heart Surgery;;  . TRANSCATHETER AORTIC VALVE REPLACEMENT, TRANSFEMORAL N/A 03/09/2018   Procedure: TRANSCATHETER AORTIC VALVE REPLACEMENT,  TRANSFEMORAL;  Surgeon: Sherren Mocha, MD;  Location: [place];  Service: Open Heart Surgery;  Laterality: N/A;     Current Outpatient Medications  Medication Sig Dispense Refill  . amoxicillin (AMOXIL) 500 MG tablet Take 4 tablets (2,000 mg) one hour prior to dental visits. 4 tablet 6  . aspirin 81 MG chewable tablet Chew 1 tablet (81 mg total) by mouth daily.    . Cholecalciferol (VITAMIN D3) 5000 units CAPS Take 5,000 Units by mouth daily.    Marland Kitchen CRANBERRY PO Take 12,600 mg by mouth daily.     Marland Kitchen lisinopril (ZESTRIL) 2.5 MG tablet Take 2.5 mg by mouth daily.    . Magnesium 125 MG CAPS Take 250 mg by mouth every evening.    . Nutritional Supplements (DHEA PO) Take 45 mg by mouth daily.     Marland Kitchen OVER THE COUNTER MEDICATION Take 320 mg by mouth 2 (two) times daily. OmegaPure     . OVER THE COUNTER MEDICATION Take 1 tablet by mouth 2 (two) times daily. K3-7-9 Supplement    . OVER THE COUNTER MEDICATION Take 1 tablet by mouth daily. Liver Protect Supplement    . OVER THE COUNTER MEDICATION Take 1 capsule by mouth daily. Heart Saver Cholesterol Supplement     . OVER THE COUNTER MEDICATION Take 1 tablet by mouth daily. Metabolic Synergy Supplement    . OVER THE COUNTER MEDICATION Apply 1 application  topically daily. Kelacream    . vitamin C (ASCORBIC ACID) 500 MG tablet Take 1,000 mg by mouth daily.      No current facility-administered medications for this visit.    Allergies:   Patient has no known allergies.    Social History:  The patient  reports that he has never smoked. He has never used smokeless tobacco. He reports that he does not drink alcohol and does not use drugs.   Family History:  The patient's ***family history includes Diabetes in his mother; Heart attack in his brother.    ROS:  Please see the history of present illness.   Otherwise, review of systems are positive for ***.   All other systems are reviewed and negative.    PHYSICAL EXAM: VS:  There were no vitals taken for  this visit. , BMI There is no height or weight on file to calculate BMI. GEN: Well nourished, well developed, in no acute distress  HEENT: normal  Neck: no JVD, carotid bruits, or masses Cardiac: ***RRR; no murmurs, rubs, or gallops,no edema  Respiratory:  clear to auscultation bilaterally, normal work of breathing GI: soft, nontender, nondistended, + BS MS: no deformity or atrophy  Skin: warm and dry, no rash Neuro:  Strength and sensation are intact Psych: euthymic mood, full affect   EKG:   The ekg ordered today demonstrates ***   Recent Labs: No results found for requested labs within last 8760 hours.   Lipid Panel    Component Value Date/Time   CHOL 218 (H) 08/24/2019 0807   TRIG 123 08/24/2019 0807   HDL 37 (L) 08/24/2019 0807   CHOLHDL 5 11/01/2013 1101   VLDL 21.8 11/01/2013 1101   LDLCALC 159 (H) 08/24/2019 0807     Other studies Reviewed: Additional studies/ records that were reviewed today with results demonstrating: ***.   ASSESSMENT AND PLAN:  1. S/p TAVR: 2. Hypertension: Low-salt diet.  Avoid processed foods. 3. Stage III CKD: Followed by Dr. Marval Regal. 4. Right bundle branch block: Watch for development of further conduction disease that may require pacemaker given prior TAVR. 5. Hyperlipidemia: Whole food, plant-based diet recommended.   Current medicines are reviewed at length with the patient today.  The patient concerns regarding his medicines were addressed.  The following changes have been made:  No change***  Labs/ tests ordered today include: *** No orders of the defined types were placed in this encounter.   Recommend 150 minutes/week of aerobic exercise Low fat, low carb, high fiber diet recommended  Disposition:   FU in ***   Signed, Larae Grooms, MD  08/05/2020 12:18 AM    [facility] [place], [place], [place]  [address] Phone: [phone]; Fax: [phone]

## 2020-08-06 ENCOUNTER — Ambulatory Visit: Payer: Medicare HMO | Admitting: Interventional Cardiology

## 2020-08-06 ENCOUNTER — Telehealth: Payer: Self-pay | Admitting: Interventional Cardiology

## 2020-08-06 DIAGNOSIS — N189 Chronic kidney disease, unspecified: Secondary | ICD-10-CM

## 2020-08-06 DIAGNOSIS — I452 Bifascicular block: Secondary | ICD-10-CM

## 2020-08-06 DIAGNOSIS — Z952 Presence of prosthetic heart valve: Secondary | ICD-10-CM

## 2020-08-06 DIAGNOSIS — I10 Essential (primary) hypertension: Secondary | ICD-10-CM

## 2020-08-06 DIAGNOSIS — E782 Mixed hyperlipidemia: Secondary | ICD-10-CM

## 2020-08-06 NOTE — Telephone Encounter (Signed)
I spoke with patient's wife.  Patient missed appointment today with Dr Irish Lack and would like to reschedule.  Wife reports patient is doing fine from a cardiac standpoint.  Appointment made for patient to see Dr Irish Lack on May 11,2022 at 3:00

## 2020-08-06 NOTE — Telephone Encounter (Signed)
patient's wife called to speak with [name] or nurse regarding his appt. She says that it is very important to talk with someone now

## 2020-08-16 DIAGNOSIS — C44319 Basal cell carcinoma of skin of other parts of face: Secondary | ICD-10-CM | POA: Diagnosis not present

## 2020-08-29 DIAGNOSIS — M19011 Primary osteoarthritis, right shoulder: Secondary | ICD-10-CM | POA: Diagnosis not present

## 2020-09-18 DIAGNOSIS — I350 Nonrheumatic aortic (valve) stenosis: Secondary | ICD-10-CM | POA: Diagnosis not present

## 2020-09-18 DIAGNOSIS — N179 Acute kidney failure, unspecified: Secondary | ICD-10-CM | POA: Diagnosis not present

## 2020-09-18 DIAGNOSIS — N1831 Chronic kidney disease, stage 3a: Secondary | ICD-10-CM | POA: Diagnosis not present

## 2020-09-18 DIAGNOSIS — Z952 Presence of prosthetic heart valve: Secondary | ICD-10-CM | POA: Diagnosis not present

## 2020-09-18 DIAGNOSIS — E669 Obesity, unspecified: Secondary | ICD-10-CM | POA: Diagnosis not present

## 2020-09-18 DIAGNOSIS — E7800 Pure hypercholesterolemia, unspecified: Secondary | ICD-10-CM | POA: Diagnosis not present

## 2020-09-18 DIAGNOSIS — R600 Localized edema: Secondary | ICD-10-CM | POA: Diagnosis not present

## 2020-09-18 DIAGNOSIS — I129 Hypertensive chronic kidney disease with stage 1 through stage 4 chronic kidney disease, or unspecified chronic kidney disease: Secondary | ICD-10-CM | POA: Diagnosis not present

## 2020-09-24 ENCOUNTER — Encounter: Payer: Self-pay | Admitting: *Deleted

## 2020-09-25 NOTE — Progress Notes (Signed)
Cardiology Office Note   Date:  09/26/2020   ID:  Robert Tapia, DOB [birth year], MRN [id number]  PCP:  Aretta Nip, MD    No chief complaint on file.  S/p AVR  Wt Readings from Last 3 Encounters:  09/26/20 208 lb 6.4 oz (94.5 kg)  08/08/19 219 lb (99.3 kg)  05/04/19 209 lb (94.8 kg)       History of Present Illness: Robert Tapia is a 87 y.o. male  with a history of RBBB with LAFB, HLD, CKD stage III, HTN and severe ASs/p TAVR (03/09/18).26 mm Edwards Sapien 3THV via the transfemoral approach.  He had aortic stenosis for many years. Further work-up was recommended but he declined for a long time. Finally, in 2019 he agreed. His mean transvalvular gradient was 76 mmHg.  Wife had a hip replacement in 2020.    Not interested in COVID vaccines.  Denies : Chest pain. Dizziness. Leg edema. Nitroglycerin use. Orthopnea. Palpitations. Paroxysmal nocturnal dyspnea. Shortness of breath. Syncope.   Past Medical History:  Diagnosis Date  . Bifascicular bundle branch block    RBBB and LAFB  . Chronic renal disease    Referred Dr. Marval Regal 08/2009  . Essential hypertension, benign   . HTN (hypertension)   . Hyperlipidemia    Pt. declines cholesterol medication (07/03/09)  . Left ventricular hypertrophy    Moderate concentric LV hypertrophy by ECHO 11/05/12  . Observation for suspected cardiovascular disease   . Pure hypercholesterolemia    LDL 164   . S/P TAVR (transcatheter aortic valve replacement) 03/09/2018   58mm Edwards Sapien 3 THV via the TF approach   . Severe aortic stenosis     Past Surgical History:  Procedure Laterality Date  . ELBOW SURGERY Right   . INTRAOPERATIVE TRANSTHORACIC ECHOCARDIOGRAM  03/09/2018   Procedure: INTRAOPERATIVE TRANSTHORACIC ECHOCARDIOGRAM;  Surgeon: Sherren Mocha, MD;  Location: [facility];  Service: Open Heart Surgery;;  . TRANSCATHETER AORTIC VALVE REPLACEMENT, TRANSFEMORAL N/A 03/09/2018   Procedure: TRANSCATHETER  AORTIC VALVE REPLACEMENT, TRANSFEMORAL;  Surgeon: Sherren Mocha, MD;  Location: [facility];  Service: Open Heart Surgery;  Laterality: N/A;     Current Outpatient Medications  Medication Sig Dispense Refill  . amoxicillin (AMOXIL) 500 MG tablet Take 4 tablets (2,000 mg) one hour prior to dental visits. 4 tablet 6  . aspirin 81 MG chewable tablet Chew 1 tablet (81 mg total) by mouth daily.    . Cholecalciferol (VITAMIN D3) 5000 units CAPS Take 5,000 Units by mouth daily.    Marland Kitchen CRANBERRY PO Take 12,600 mg by mouth daily.     Marland Kitchen lisinopril (ZESTRIL) 2.5 MG tablet Take 2.5 mg by mouth daily.    . Magnesium 125 MG CAPS Take 250 mg by mouth every evening.    . Nutritional Supplements (DHEA PO) Take 45 mg by mouth daily.     Marland Kitchen OVER THE COUNTER MEDICATION Take 320 mg by mouth 2 (two) times daily. OmegaPure    . OVER THE COUNTER MEDICATION Take 1 tablet by mouth 2 (two) times daily. K3-7-9 Supplement    . OVER THE COUNTER MEDICATION Take 1 tablet by mouth daily. Liver Protect Supplement    . OVER THE COUNTER MEDICATION Take 1 capsule by mouth daily. Heart Saver Cholesterol Supplement    . OVER THE COUNTER MEDICATION Take 1 tablet by mouth daily. Metabolic Synergy Supplement    . OVER THE COUNTER MEDICATION Apply 1 application topically daily. Kelacream    . vitamin C (  ASCORBIC ACID) 500 MG tablet Take 1,000 mg by mouth daily.      No current facility-administered medications for this visit.    Allergies:   Patient has no known allergies.    Social History:  The patient  reports that he has never smoked. He has never used smokeless tobacco. He reports that he does not drink alcohol and does not use drugs.   Family History:  The patient's family history includes Diabetes in his mother; Heart attack in his brother.    ROS:  Please see the history of present illness.   Otherwise, review of systems are positive for needs to find a new dentist.   All other systems are reviewed and negative.     PHYSICAL EXAM: VS:  BP 124/72   Pulse 92   Ht 5\' 10"  (1.778 m)   Wt 208 lb 6.4 oz (94.5 kg)   SpO2 95%   BMI 29.90 kg/m  , BMI Body mass index is 29.9 kg/m. GEN: Well nourished, well developed, in no acute distress  HEENT: normal  Neck: no JVD, carotid bruits, or masses Cardiac: RRR; no murmurs, rubs, or gallops,no edema  Respiratory:  clear to auscultation bilaterally, normal work of breathing GI: soft, nontender, nondistended, + BS MS: no deformity or atrophy  Skin: warm and dry, no rash Neuro:  Strength and sensation are intact Psych: euthymic mood, full affect   EKG:   The ekg ordered today demonstrates NSR, RBBB   Recent Labs: No results found for requested labs within last 8760 hours.   Lipid Panel    Component Value Date/Time   CHOL 218 (H) 08/24/2019 0807   TRIG 123 08/24/2019 0807   HDL 37 (L) 08/24/2019 0807   CHOLHDL 5 11/01/2013 1101   VLDL 21.8 11/01/2013 1101   LDLCALC 159 (H) 08/24/2019 0807     Other studies Reviewed: Additional studies/ records that were reviewed today with results demonstrating: labs reviewed.   ASSESSMENT AND PLAN:  1. S/p TAVR: needs SBE prophylaxis. I stressed the importance of twice a year dental cleanings.  2. HTN: low salt diet.  Regular exercise target as noted below.  3. Stage III CKD: Follows with renal. Stable. 4. RBBB: Chronic.   5. Hyperlipidemia: LDL 159.  Declined a statin.    Current medicines are reviewed at length with the patient today.  The patient concerns regarding his medicines were addressed.  The following changes have been made:  No change  Labs/ tests ordered today include:  No orders of the defined types were placed in this encounter.   Recommend 150 minutes/week of aerobic exercise Low fat, low carb, high fiber diet recommended  Disposition:   FU in 1 year  He would like to meet Dr. Roxy Manns and Dr. Burt Knack to thank them for the TAVR.   Signed, Larae Grooms, MD  09/26/2020 3:15 PM     [facility] [address], [place], [place]  [address] Phone: [phone]; Fax: [phone]

## 2020-09-26 ENCOUNTER — Ambulatory Visit: Payer: Medicare HMO | Admitting: Interventional Cardiology

## 2020-09-26 ENCOUNTER — Other Ambulatory Visit: Payer: Self-pay

## 2020-09-26 ENCOUNTER — Encounter: Payer: Self-pay | Admitting: Interventional Cardiology

## 2020-09-26 VITALS — BP 124/72 | HR 92 | Ht 70.0 in | Wt 208.4 lb

## 2020-09-26 DIAGNOSIS — I10 Essential (primary) hypertension: Secondary | ICD-10-CM

## 2020-09-26 DIAGNOSIS — E782 Mixed hyperlipidemia: Secondary | ICD-10-CM | POA: Diagnosis not present

## 2020-09-26 DIAGNOSIS — I452 Bifascicular block: Secondary | ICD-10-CM

## 2020-09-26 DIAGNOSIS — N189 Chronic kidney disease, unspecified: Secondary | ICD-10-CM | POA: Diagnosis not present

## 2020-09-26 DIAGNOSIS — Z952 Presence of prosthetic heart valve: Secondary | ICD-10-CM

## 2020-09-26 NOTE — Patient Instructions (Signed)

## 2020-11-01 DIAGNOSIS — N1831 Chronic kidney disease, stage 3a: Secondary | ICD-10-CM | POA: Diagnosis not present

## 2020-11-01 DIAGNOSIS — N390 Urinary tract infection, site not specified: Secondary | ICD-10-CM | POA: Diagnosis not present

## 2020-12-17 DIAGNOSIS — M19011 Primary osteoarthritis, right shoulder: Secondary | ICD-10-CM | POA: Diagnosis not present

## 2020-12-17 DIAGNOSIS — M19012 Primary osteoarthritis, left shoulder: Secondary | ICD-10-CM | POA: Diagnosis not present

## 2021-02-05 DIAGNOSIS — N1831 Chronic kidney disease, stage 3a: Secondary | ICD-10-CM | POA: Diagnosis not present

## 2021-02-20 DIAGNOSIS — I10 Essential (primary) hypertension: Secondary | ICD-10-CM | POA: Diagnosis not present

## 2021-02-20 DIAGNOSIS — N183 Chronic kidney disease, stage 3 unspecified: Secondary | ICD-10-CM | POA: Diagnosis not present

## 2021-02-20 DIAGNOSIS — H6123 Impacted cerumen, bilateral: Secondary | ICD-10-CM | POA: Diagnosis not present

## 2021-02-20 DIAGNOSIS — Z952 Presence of prosthetic heart valve: Secondary | ICD-10-CM | POA: Diagnosis not present

## 2021-02-20 DIAGNOSIS — Z2821 Immunization not carried out because of patient refusal: Secondary | ICD-10-CM | POA: Diagnosis not present

## 2021-02-20 DIAGNOSIS — Z0000 Encounter for general adult medical examination without abnormal findings: Secondary | ICD-10-CM | POA: Diagnosis not present

## 2021-04-04 DIAGNOSIS — I129 Hypertensive chronic kidney disease with stage 1 through stage 4 chronic kidney disease, or unspecified chronic kidney disease: Secondary | ICD-10-CM | POA: Diagnosis not present

## 2021-04-04 DIAGNOSIS — D631 Anemia in chronic kidney disease: Secondary | ICD-10-CM | POA: Diagnosis not present

## 2021-04-04 DIAGNOSIS — N2581 Secondary hyperparathyroidism of renal origin: Secondary | ICD-10-CM | POA: Diagnosis not present

## 2021-04-04 DIAGNOSIS — N1831 Chronic kidney disease, stage 3a: Secondary | ICD-10-CM | POA: Diagnosis not present

## 2021-04-04 DIAGNOSIS — N189 Chronic kidney disease, unspecified: Secondary | ICD-10-CM | POA: Diagnosis not present

## 2021-05-08 DIAGNOSIS — N1831 Chronic kidney disease, stage 3a: Secondary | ICD-10-CM | POA: Diagnosis not present

## 2021-06-03 DIAGNOSIS — H5203 Hypermetropia, bilateral: Secondary | ICD-10-CM | POA: Diagnosis not present

## 2021-06-03 DIAGNOSIS — H52209 Unspecified astigmatism, unspecified eye: Secondary | ICD-10-CM | POA: Diagnosis not present

## 2021-06-03 DIAGNOSIS — H524 Presbyopia: Secondary | ICD-10-CM | POA: Diagnosis not present

## 2021-06-03 DIAGNOSIS — H35033 Hypertensive retinopathy, bilateral: Secondary | ICD-10-CM | POA: Diagnosis not present

## 2021-07-08 DIAGNOSIS — R69 Illness, unspecified: Secondary | ICD-10-CM | POA: Diagnosis not present

## 2021-07-08 DIAGNOSIS — N183 Chronic kidney disease, stage 3 unspecified: Secondary | ICD-10-CM | POA: Diagnosis not present

## 2021-07-08 DIAGNOSIS — M19012 Primary osteoarthritis, left shoulder: Secondary | ICD-10-CM | POA: Diagnosis not present

## 2021-07-08 DIAGNOSIS — F322 Major depressive disorder, single episode, severe without psychotic features: Secondary | ICD-10-CM | POA: Diagnosis not present

## 2021-07-08 DIAGNOSIS — Z952 Presence of prosthetic heart valve: Secondary | ICD-10-CM | POA: Diagnosis not present

## 2021-07-08 DIAGNOSIS — M19011 Primary osteoarthritis, right shoulder: Secondary | ICD-10-CM | POA: Diagnosis not present

## 2021-07-08 DIAGNOSIS — H6123 Impacted cerumen, bilateral: Secondary | ICD-10-CM | POA: Diagnosis not present

## 2021-07-08 DIAGNOSIS — I10 Essential (primary) hypertension: Secondary | ICD-10-CM | POA: Diagnosis not present

## 2021-07-09 DIAGNOSIS — M19012 Primary osteoarthritis, left shoulder: Secondary | ICD-10-CM | POA: Diagnosis not present

## 2021-07-09 DIAGNOSIS — M19011 Primary osteoarthritis, right shoulder: Secondary | ICD-10-CM | POA: Diagnosis not present

## 2021-08-08 DIAGNOSIS — N1831 Chronic kidney disease, stage 3a: Secondary | ICD-10-CM | POA: Diagnosis not present

## 2021-09-04 DIAGNOSIS — Z8249 Family history of ischemic heart disease and other diseases of the circulatory system: Secondary | ICD-10-CM | POA: Diagnosis not present

## 2021-09-04 DIAGNOSIS — Z008 Encounter for other general examination: Secondary | ICD-10-CM | POA: Diagnosis not present

## 2021-09-04 DIAGNOSIS — Z833 Family history of diabetes mellitus: Secondary | ICD-10-CM | POA: Diagnosis not present

## 2021-09-04 DIAGNOSIS — I10 Essential (primary) hypertension: Secondary | ICD-10-CM | POA: Diagnosis not present

## 2021-09-04 DIAGNOSIS — R69 Illness, unspecified: Secondary | ICD-10-CM | POA: Diagnosis not present

## 2021-09-04 DIAGNOSIS — Z6825 Body mass index (BMI) 25.0-25.9, adult: Secondary | ICD-10-CM | POA: Diagnosis not present

## 2021-09-04 DIAGNOSIS — E663 Overweight: Secondary | ICD-10-CM | POA: Diagnosis not present

## 2021-09-04 DIAGNOSIS — G8929 Other chronic pain: Secondary | ICD-10-CM | POA: Diagnosis not present

## 2021-09-11 DIAGNOSIS — N183 Chronic kidney disease, stage 3 unspecified: Secondary | ICD-10-CM | POA: Diagnosis not present

## 2021-10-01 DIAGNOSIS — Z952 Presence of prosthetic heart valve: Secondary | ICD-10-CM | POA: Diagnosis not present

## 2021-10-01 DIAGNOSIS — N2581 Secondary hyperparathyroidism of renal origin: Secondary | ICD-10-CM | POA: Diagnosis not present

## 2021-10-01 DIAGNOSIS — N189 Chronic kidney disease, unspecified: Secondary | ICD-10-CM | POA: Diagnosis not present

## 2021-10-01 DIAGNOSIS — I129 Hypertensive chronic kidney disease with stage 1 through stage 4 chronic kidney disease, or unspecified chronic kidney disease: Secondary | ICD-10-CM | POA: Diagnosis not present

## 2021-10-01 DIAGNOSIS — D631 Anemia in chronic kidney disease: Secondary | ICD-10-CM | POA: Diagnosis not present

## 2021-10-01 DIAGNOSIS — E559 Vitamin D deficiency, unspecified: Secondary | ICD-10-CM | POA: Diagnosis not present

## 2021-10-01 DIAGNOSIS — N179 Acute kidney failure, unspecified: Secondary | ICD-10-CM | POA: Diagnosis not present

## 2021-10-01 DIAGNOSIS — E7800 Pure hypercholesterolemia, unspecified: Secondary | ICD-10-CM | POA: Diagnosis not present

## 2021-10-01 DIAGNOSIS — N1831 Chronic kidney disease, stage 3a: Secondary | ICD-10-CM | POA: Diagnosis not present

## 2021-11-07 DIAGNOSIS — N1831 Chronic kidney disease, stage 3a: Secondary | ICD-10-CM | POA: Diagnosis not present

## 2022-08-18 DEATH — deceased
